# Patient Record
Sex: Female | Born: 1955 | Race: White | Hispanic: No | Marital: Married | Smoking: Former smoker
Health system: Southern US, Community
[De-identification: ages and names within clinical notes are randomized; demographics above are authoritative.]

## PROBLEM LIST (undated history)

## (undated) DIAGNOSIS — H01003 Unspecified blepharitis right eye, unspecified eyelid: Secondary | ICD-10-CM

## (undated) DIAGNOSIS — H35371 Puckering of macula, right eye: Secondary | ICD-10-CM

## (undated) DIAGNOSIS — H409 Unspecified glaucoma: Secondary | ICD-10-CM

## (undated) DIAGNOSIS — I10 Essential (primary) hypertension: Secondary | ICD-10-CM

## (undated) DIAGNOSIS — E785 Hyperlipidemia, unspecified: Secondary | ICD-10-CM

## (undated) DIAGNOSIS — F419 Anxiety disorder, unspecified: Secondary | ICD-10-CM

## (undated) DIAGNOSIS — H3530 Unspecified macular degeneration: Secondary | ICD-10-CM

## (undated) DIAGNOSIS — M1990 Unspecified osteoarthritis, unspecified site: Secondary | ICD-10-CM

## (undated) DIAGNOSIS — K219 Gastro-esophageal reflux disease without esophagitis: Secondary | ICD-10-CM

## (undated) DIAGNOSIS — I471 Supraventricular tachycardia, unspecified: Secondary | ICD-10-CM

## (undated) DIAGNOSIS — E038 Other specified hypothyroidism: Secondary | ICD-10-CM

## (undated) DIAGNOSIS — E063 Autoimmune thyroiditis: Secondary | ICD-10-CM

## (undated) DIAGNOSIS — H04123 Dry eye syndrome of bilateral lacrimal glands: Secondary | ICD-10-CM

## (undated) DIAGNOSIS — J309 Allergic rhinitis, unspecified: Secondary | ICD-10-CM

## (undated) DIAGNOSIS — K449 Diaphragmatic hernia without obstruction or gangrene: Secondary | ICD-10-CM

## (undated) DIAGNOSIS — R519 Headache, unspecified: Secondary | ICD-10-CM

## (undated) HISTORY — PX: BREAST EXCISIONAL BIOPSY: SUR124

## (undated) HISTORY — PX: HAND SURGERY: SHX662

## (undated) HISTORY — PX: COLONOSCOPY: SHX174

## (undated) HISTORY — PX: CATARACT EXTRACTION: SUR2

## (undated) HISTORY — PX: LAMINECTOMY: SHX219

## (undated) HISTORY — PX: TONSILLECTOMY: SUR1361

## (undated) HISTORY — DX: Allergic rhinitis, unspecified: J30.9

## (undated) HISTORY — PX: CHOLECYSTECTOMY: SHX55

## (undated) HISTORY — PX: YAG LASER APPLICATION: SHX6189

## (undated) HISTORY — PX: VAGINAL HYSTERECTOMY: SUR661

## (undated) HISTORY — PX: APPENDECTOMY: SHX54

## (undated) HISTORY — PX: BIOPSY THYROID: PRO38

## (undated) HISTORY — DX: Unspecified osteoarthritis, unspecified site: M19.90

## (undated) HISTORY — PX: BACK SURGERY: SHX140

## (undated) HISTORY — PX: OTHER SURGICAL HISTORY: SHX169

## (undated) HISTORY — DX: Hyperlipidemia, unspecified: E78.5

---

## 2011-11-14 ENCOUNTER — Other Ambulatory Visit: Payer: Self-pay | Admitting: Internal Medicine

## 2011-11-14 DIAGNOSIS — Z1231 Encounter for screening mammogram for malignant neoplasm of breast: Secondary | ICD-10-CM

## 2011-11-26 ENCOUNTER — Ambulatory Visit
Admission: RE | Admit: 2011-11-26 | Discharge: 2011-11-26 | Disposition: A | Discharge status: Home / Self Care | Payer: Managed Care, Other (non HMO) | Source: Ambulatory Visit | Attending: Internal Medicine | Admitting: Internal Medicine

## 2011-11-26 DIAGNOSIS — Z1231 Encounter for screening mammogram for malignant neoplasm of breast: Secondary | ICD-10-CM

## 2011-12-04 ENCOUNTER — Other Ambulatory Visit: Payer: Self-pay | Admitting: Internal Medicine

## 2011-12-04 DIAGNOSIS — R928 Other abnormal and inconclusive findings on diagnostic imaging of breast: Secondary | ICD-10-CM

## 2011-12-17 ENCOUNTER — Ambulatory Visit
Admission: RE | Admit: 2011-12-17 | Discharge: 2011-12-17 | Disposition: A | Discharge status: Home / Self Care | Payer: Managed Care, Other (non HMO) | Source: Ambulatory Visit | Attending: Internal Medicine | Admitting: Internal Medicine

## 2011-12-17 DIAGNOSIS — R928 Other abnormal and inconclusive findings on diagnostic imaging of breast: Secondary | ICD-10-CM

## 2012-04-10 ENCOUNTER — Other Ambulatory Visit: Payer: Self-pay | Admitting: Internal Medicine

## 2012-04-10 DIAGNOSIS — N6002 Solitary cyst of left breast: Secondary | ICD-10-CM

## 2012-06-17 ENCOUNTER — Ambulatory Visit
Admission: RE | Admit: 2012-06-17 | Discharge: 2012-06-17 | Disposition: A | Discharge status: Home / Self Care | Payer: Managed Care, Other (non HMO) | Source: Ambulatory Visit | Attending: Internal Medicine | Admitting: Internal Medicine

## 2012-06-17 DIAGNOSIS — N6002 Solitary cyst of left breast: Secondary | ICD-10-CM

## 2012-07-16 ENCOUNTER — Other Ambulatory Visit: Payer: Self-pay | Admitting: *Deleted

## 2012-07-16 DIAGNOSIS — M5410 Radiculopathy, site unspecified: Secondary | ICD-10-CM

## 2012-07-20 ENCOUNTER — Other Ambulatory Visit: Payer: Managed Care, Other (non HMO)

## 2012-07-23 ENCOUNTER — Ambulatory Visit
Admission: RE | Admit: 2012-07-23 | Discharge: 2012-07-23 | Disposition: A | Discharge status: Home / Self Care | Payer: Managed Care, Other (non HMO) | Source: Ambulatory Visit | Attending: *Deleted | Admitting: *Deleted

## 2012-07-23 DIAGNOSIS — M5410 Radiculopathy, site unspecified: Secondary | ICD-10-CM

## 2012-07-23 MED ORDER — GADOBENATE DIMEGLUMINE 529 MG/ML IV SOLN
18.0000 mL | Freq: Once | INTRAVENOUS | Status: AC | PRN
  Administered 2012-07-23: 18 mL via INTRAVENOUS

## 2012-10-14 ENCOUNTER — Other Ambulatory Visit: Payer: Self-pay

## 2012-10-14 DIAGNOSIS — Z1231 Encounter for screening mammogram for malignant neoplasm of breast: Secondary | ICD-10-CM

## 2012-11-26 ENCOUNTER — Ambulatory Visit
Admission: RE | Admit: 2012-11-26 | Discharge: 2012-11-26 | Disposition: A | Discharge status: Home / Self Care | Payer: Managed Care, Other (non HMO) | Source: Ambulatory Visit

## 2012-11-26 DIAGNOSIS — Z1231 Encounter for screening mammogram for malignant neoplasm of breast: Secondary | ICD-10-CM

## 2013-12-06 ENCOUNTER — Other Ambulatory Visit: Payer: Self-pay

## 2013-12-06 DIAGNOSIS — Z1231 Encounter for screening mammogram for malignant neoplasm of breast: Secondary | ICD-10-CM

## 2013-12-24 ENCOUNTER — Ambulatory Visit
Admission: RE | Admit: 2013-12-24 | Discharge: 2013-12-24 | Disposition: A | Discharge status: Home / Self Care | Payer: 59 | Source: Ambulatory Visit

## 2013-12-24 DIAGNOSIS — Z1231 Encounter for screening mammogram for malignant neoplasm of breast: Secondary | ICD-10-CM

## 2013-12-28 ENCOUNTER — Other Ambulatory Visit: Payer: Self-pay | Admitting: Internal Medicine

## 2013-12-28 DIAGNOSIS — R928 Other abnormal and inconclusive findings on diagnostic imaging of breast: Secondary | ICD-10-CM

## 2014-01-05 ENCOUNTER — Ambulatory Visit
Admission: RE | Admit: 2014-01-05 | Discharge: 2014-01-05 | Disposition: A | Discharge status: Home / Self Care | Payer: 59 | Source: Ambulatory Visit | Attending: Internal Medicine | Admitting: Internal Medicine

## 2014-01-05 DIAGNOSIS — R928 Other abnormal and inconclusive findings on diagnostic imaging of breast: Secondary | ICD-10-CM

## 2014-04-04 ENCOUNTER — Other Ambulatory Visit: Payer: Self-pay | Admitting: Internal Medicine

## 2014-04-04 DIAGNOSIS — I8390 Asymptomatic varicose veins of unspecified lower extremity: Secondary | ICD-10-CM

## 2014-04-14 ENCOUNTER — Other Ambulatory Visit: Payer: 59

## 2014-04-14 ENCOUNTER — Ambulatory Visit
Admission: RE | Admit: 2014-04-14 | Discharge: 2014-04-14 | Disposition: A | Discharge status: Home / Self Care | Payer: 59 | Source: Ambulatory Visit | Attending: Internal Medicine | Admitting: Internal Medicine

## 2014-04-14 ENCOUNTER — Other Ambulatory Visit: Payer: Self-pay | Admitting: Internal Medicine

## 2014-04-14 DIAGNOSIS — I8390 Asymptomatic varicose veins of unspecified lower extremity: Secondary | ICD-10-CM | POA: Insufficient documentation

## 2014-04-14 HISTORY — DX: Unspecified osteoarthritis, unspecified site: M19.90

## 2014-04-14 HISTORY — DX: Autoimmune thyroiditis: E06.3

## 2014-04-14 HISTORY — DX: Diaphragmatic hernia without obstruction or gangrene: K44.9

## 2014-04-14 HISTORY — DX: Unspecified macular degeneration: H35.30

## 2014-04-14 HISTORY — DX: Other specified hypothyroidism: E03.8

## 2014-04-14 HISTORY — DX: Essential (primary) hypertension: I10

## 2014-04-14 NOTE — Consult Note (Signed)
Chief Complaint: Chief Complaint  Patient presents with  . Advice Only    Consult for Varicose Veins    Referring Physician(s): Ramachandran,Ajith  History of Present Illness: Diana Mcpherson is a 59 y.o. female bilateral lower extremity pain. She says that she's been having symptoms in her lower extremities for years but it appears to be getting worse. The symptoms are more prominent in the left lower extremity than the right. The pain ranges from a deep dull aching pain to sharp pains. She says that the symptoms can last 2-3 days on some occasions. She says the most prominent pain is along the medial aspect of the left calf associated with visible varicosities. She does wear compression stockings on occasion and says that it gives her minimal relief. She complains of some occasional lower extremity and ankle swelling. She denies skin breakdown or ulcerations. She occasionally has shooting pains in her legs and has chronic back problems.   Past Medical History  Diagnosis Date  . Hypertension   . Hypothyroidism due to Hashimoto's thyroiditis   . Hiatal hernia   . Macular degeneration disease   . Osteoarthritis     Past Surgical History  Procedure Laterality Date  . Back surgery    . Tonsillectomy    . Cholecystectomy    . Appendectomy    . Vaginal hysterectomy      Allergies: Contrast media and Morphine and related  Medications: Prior to Admission medications   Medication Sig Start Date End Date Taking? Authorizing Provider  atorvastatin (LIPITOR) 10 MG tablet Take 10 mg by mouth daily.   Yes Historical Provider, MD  Calcium-Vitamin D (CALTRATE 600 PLUS-VIT D PO) Take 1 tablet by mouth daily.   Yes Historical Provider, MD  conjugated estrogens (PREMARIN) vaginal cream Place 1 Applicatorful vaginally daily.   Yes Historical Provider, MD  Cranberry 1000 MG CAPS Take 1,000 mg by mouth 2 (two) times daily.   Yes Historical Provider, MD  diazepam (VALIUM) 10 MG tablet Take 10  mg by mouth every 6 (six) hours as needed for anxiety.   Yes Historical Provider, MD  estradiol (VIVELLE-DOT) 0.025 MG/24HR Place 1 patch onto the skin 2 (two) times a week.   Yes Historical Provider, MD  HYDROcodone-acetaminophen (NORCO) 10-325 MG per tablet Take 1 tablet by mouth every 6 (six) hours as needed.   Yes Historical Provider, MD  levothyroxine (SYNTHROID, LEVOTHROID) 88 MCG tablet Take 88 mcg by mouth daily before breakfast.   Yes Historical Provider, MD  Misc Natural Products (OSTEO BI-FLEX ADV DOUBLE ST PO) Take by mouth 2 (two) times daily.   Yes Historical Provider, MD  Multiple Vitamins-Minerals (EYE VITAMINS) CAPS Take 1 capsule by mouth 2 (two) times daily.   Yes Historical Provider, MD  Omega-3 Fatty Acids (FISH OIL) 1000 MG CAPS Take 1,000 mg by mouth 2 (two) times daily.   Yes Historical Provider, MD  valsartan (DIOVAN) 80 MG tablet Take 80 mg by mouth daily.   Yes Historical Provider, MD     No family history on file.  History   Social History  . Marital Status: Married    Spouse Name: N/A  . Number of Children: N/A  . Years of Education: N/A   Social History Main Topics  . Smoking status: Former Smoker -- 1.00 packs/day    Types: Cigarettes    Start date: 04/13/1972    Quit date: 04/14/1979  . Smokeless tobacco: Never Used  . Alcohol Use: No  .  Drug Use: Not on file  . Sexual Activity: Not on file   Other Topics Concern  . None   Social History Narrative  . None      Review of Systems  Musculoskeletal: Positive for back pain.  Neurological:       Occasional "nerve pain" in legs    Vital Signs: BP 138/78 mmHg  Pulse 72  Temp(Src) 97.7 F (36.5 C) (Oral)  Resp 14  Ht  (1.676 m)  Wt 177 lb (80.287 kg)  BMI 28.58 kg/m2  SpO2 100%  Physical Exam  Constitutional: She is oriented to person, place, and time. She appears well-developed and well-nourished.  Cardiovascular: Normal rate, regular rhythm and normal heart sounds.     Pulmonary/Chest: Effort normal and breath sounds normal.  Abdominal: Soft. There is tenderness.  Tenderness to deep palpation in the abdomen.  Musculoskeletal:  Right leg:  Palpable and visible spider veins along the lateral right calf. No large varicosities. Strong palpable pulse at the right DP. Mild swelling in the right ankle and difficult to palpate the PT. Difficult to palpate the right groin pulse.  Left leg: Visible and palpable varicosities along the medial left upper calf. Strong left DP pulse. Unable to palpate the left PT because the patient complained of a shooting pain whenever I would touch this area. Minimal swelling of the left ankle. No erythema.  Neurological: She is alert and oriented to person, place, and time.      Imaging: 04/14/14 CLINICAL DATA: 59 year old with bilateral leg pain, left side greater than right. Visible varicosities in the left calf.  EXAM: BILATERAL LOWER EXTREMITY VENOUS DUPLEX ULTRASOUND  TECHNIQUE: Gray-scale sonography with graded compression, as well as color Doppler and duplex ultrasound, were performed to evaluate the deep and superficial veins of both lower extremities. Spectral Doppler was utilized to evaluate flow at rest and with distal augmentation maneuvers. A complete superficial venous insufficiency exam was performed in the upright standing position. I personally performed the technical portion of the exam.  COMPARISON: None.  FINDINGS: Right lower extremity:  Deep venous system: Normal compressibility, augmentation and color Doppler flow in the right common femoral vein, right femoral vein and right popliteal vein. The right saphenofemoral junction is patent.  Superficial venous system: No reflux at the right saphenofemoral junction. There is extensive reflux in the proximal thigh great saphenous vein measuring up to 5.5 seconds. Proximal right thigh great saphenous vein measures up to 7 mm. There is also  enlargement of the distal thigh great saphenous vein measuring up to 8 mm and there is approximately 5.6 seconds of reflux in this segment. Prominent perforator branch in the mid calf. No significant reflux in the calf great saphenous vein. Normal appearance of the right short saphenous vein without reflux.  Left lower extremity:  Deep venous system: Normal compressibility, augmentation and color Doppler flow in the left common femoral vein, left femoral vein and left popliteal vein.  Superficial venous system: The proximal thigh left great saphenous vein is enlarged measuring up to 12 mm and there is 5.5 seconds of reflux. 2.3 seconds reflux in the mid thigh great saphenous vein. 2.3 seconds of reflux in the distal thigh great saphenous vein. 2.3 seconds of reflux in the left great saphenous vein at the knee. There are large varicosities coming off the proximal calf great saphenous vein. These varicose veins demonstrate reflux. No significant reflux in the left great saphenous vein distal to the varicose veins. Normal appearance of the left  short saphenous vein without reflux.  IMPRESSION: Positive for superficial venous insufficiency in the great saphenous veins bilaterally. Prominent varicose veins associated with the left great saphenous vein in the calf.  Negative for deep vein thrombosis.  Labs:  CBC: No results for input(s): WBC, HGB, HCT, PLT in the last 8760 hours.  COAGS: No results for input(s): INR, APTT in the last 8760 hours.  BMP: No results for input(s): NA, K, CL, CO2, GLUCOSE, BUN, CALCIUM, CREATININE, GFRNONAA, GFRAA in the last 8760 hours.  Invalid input(s): CMP  Assessment and Plan:  59 year old with bilateral leg pain and visible varicosities in the left calf. Based on physical exam and ultrasound findings, the patient has superficial venous insufficiency in the great saphenous veins bilaterally. The CEAP classification is C2, Ep, As, Pr. The  patient is symptomatically in both lower extremities but the left side is much worse than the right. The patient has been using compression stockings intermittently and I feel that she has failed conservative management. The patient has strange neurologic sensations with shooting pains in both lower extremity. I suspect that these symptoms are neurologic in origin and separate from the venous insufficiency symptoms.  The patient is a good candidate for endovascular laser treatment to the great saphenous veins bilaterally. The left lower extremity is the more symptomatic side and we would plan to treat this leg first. In addition, there are perforator branches and large varicosities in the left leg and the patient may also need ultrasound-guided foam sclerotherapy in the left lower extremity. I have given the patient a prescription for lower extremity compression stockings measuring 20-30 mmHg. I have encouraged patient to wear these stockings as much as possible, particularly when she is going to be sitting or standing for long periods of time.  The patient would like to think about the endovascular treatment a little longer and she will need to consider the timing of the procedure because we recommend limiting travel for one month after the procedure. Patient will contact us when she would like to proceed with the endovascular treatment.  Thank you for this interesting consult.  I greatly enjoyed meeting Diana Bearderesa Accomando and look forward to participating in their care.  SignedAbundio Miu: Jamason Peckham RYAN 04/14/2014, 2:44 PM   I spent a total of  20 Minutes  in face to face in clinical consultation, greater than 50% of which was counseling/coordinating care for lower extremity venous insufficiency.

## 2014-07-04 ENCOUNTER — Emergency Department (HOSPITAL_COMMUNITY)
Admission: EM | Admit: 2014-07-04 | Discharge: 2014-07-04 | Disposition: A | Discharge status: Home / Self Care | Payer: Managed Care, Other (non HMO) | Attending: Emergency Medicine | Admitting: Emergency Medicine

## 2014-07-04 ENCOUNTER — Encounter (HOSPITAL_COMMUNITY): Payer: Self-pay | Admitting: Emergency Medicine

## 2014-07-04 DIAGNOSIS — I10 Essential (primary) hypertension: Secondary | ICD-10-CM | POA: Diagnosis not present

## 2014-07-04 DIAGNOSIS — Z8719 Personal history of other diseases of the digestive system: Secondary | ICD-10-CM | POA: Diagnosis not present

## 2014-07-04 DIAGNOSIS — R109 Unspecified abdominal pain: Secondary | ICD-10-CM | POA: Diagnosis not present

## 2014-07-04 DIAGNOSIS — Z87891 Personal history of nicotine dependence: Secondary | ICD-10-CM | POA: Diagnosis not present

## 2014-07-04 DIAGNOSIS — E039 Hypothyroidism, unspecified: Secondary | ICD-10-CM | POA: Insufficient documentation

## 2014-07-04 DIAGNOSIS — Z79899 Other long term (current) drug therapy: Secondary | ICD-10-CM | POA: Insufficient documentation

## 2014-07-04 DIAGNOSIS — M5441 Lumbago with sciatica, right side: Secondary | ICD-10-CM | POA: Diagnosis not present

## 2014-07-04 DIAGNOSIS — M545 Low back pain: Secondary | ICD-10-CM | POA: Diagnosis present

## 2014-07-04 LAB — URINALYSIS, ROUTINE W REFLEX MICROSCOPIC
Bilirubin Urine: NEGATIVE
GLUCOSE, UA: NEGATIVE mg/dL
Ketones, ur: NEGATIVE mg/dL
LEUKOCYTES UA: NEGATIVE
Nitrite: NEGATIVE
PH: 7 (ref 5.0–8.0)
Protein, ur: NEGATIVE mg/dL
Specific Gravity, Urine: 1.007 (ref 1.005–1.030)
Urobilinogen, UA: 0.2 mg/dL (ref 0.0–1.0)

## 2014-07-04 LAB — URINE MICROSCOPIC-ADD ON

## 2014-07-04 MED ORDER — OXYCODONE-ACETAMINOPHEN 5-325 MG PO TABS: 2.0000 | ORAL_TABLET | ORAL | Status: DC | PRN

## 2014-07-04 NOTE — ED Provider Notes (Signed)
CSN: [id number]     Arrival date & time 07/04/14  1516 History   First MD Initiated Contact with Patient 07/04/14 2004     Chief Complaint  Patient presents with  . Back Pain  . Abdominal Pain     (Consider location/radiation/quality/duration/timing/severity/associated sxs/prior Treatment) Patient is a 59 y.o. female presenting with back pain and abdominal pain. The history is provided by the patient. No language interpreter was used.  Back Pain Associated symptoms: no abdominal pain, no numbness and no weakness   Abdominal Pain  [name] is a 59 year old female with a history of hypertension, hypothyroidism, osteoarthritis, laminectomy and fusion at L4-L5, who presents for sudden onset severe low back pain while moving suddenly on Wednesday. She states she drove to [place] on Monday and was fine at that time. She states it was aggravated when driving back from [place] on Friday. She states she took Vicodin, Valium, used ice with minimal relief. She states the pain radiates along the right buttocks and thigh. She does state there was pain in her right fifth toe for 3 days but is since resolved. She denies any bowel or bladder incontinence or retention, recent prednisone use, IV drug use, history of cancer. She denies any weakness, numbness, tingling in the right leg. She denies any fever, chills, abdominal pain, nausea, vomiting. She denies any recent history of fall or injury. Past Medical History  Diagnosis Date  . Hypertension   . Hypothyroidism due to Hashimoto's thyroiditis   . Hiatal hernia   . Macular degeneration disease   . Osteoarthritis    Past Surgical History  Procedure Laterality Date  . Back surgery    . Tonsillectomy    . Cholecystectomy    . Appendectomy    . Vaginal hysterectomy     No family history on file. History  Substance Use Topics  . Smoking status: Former Smoker -- 1.00 packs/day    Types: Cigarettes    Start date: 04/13/1972    Quit date:  04/14/1979  . Smokeless tobacco: Never Used  . Alcohol Use: No   OB History    No data available     Review of Systems  Gastrointestinal: Negative for abdominal pain.  Musculoskeletal: Positive for back pain. Negative for myalgias, joint swelling, arthralgias, gait problem, neck pain and neck stiffness.  Neurological: Negative for weakness and numbness.  All other systems reviewed and are negative.     Allergies  Contrast media; Cymbalta; Relafen; and Morphine and related  Home Medications   Prior to Admission medications   Medication Sig Start Date End Date Taking? Authorizing Provider  atorvastatin (LIPITOR) 10 MG tablet Take 10 mg by mouth 3 (three) times a week. Mon / Wed / Fri   Yes Historical Provider, MD  calcium carbonate (TUMS - DOSED IN MG ELEMENTAL CALCIUM) 500 MG chewable tablet Chew 1 tablet by mouth daily as needed for indigestion or heartburn.   Yes Historical Provider, MD  Calcium-Vitamin D (CALTRATE 600 PLUS-VIT D PO) Take 1 tablet by mouth daily.   Yes Historical Provider, MD  cephALEXin (KEFLEX) 250 MG capsule Take 250 mg by mouth daily as needed (after intercourse).   Yes Historical Provider, MD  conjugated estrogens (PREMARIN) vaginal cream Place 1 Applicatorful vaginally 3 (three) times a week. Mon / Wed / Fri   Yes Historical Provider, MD  Cranberry 1000 MG CAPS Take 1,000 mg by mouth 2 (two) times daily.   Yes Historical Provider, MD  diazepam (VALIUM) 10  MG tablet Take 10 mg by mouth every 6 (six) hours as needed for anxiety.   Yes Historical Provider, MD  docusate sodium (COLACE) 100 MG capsule Take 100 mg by mouth daily as needed for mild constipation.   Yes Historical Provider, MD  estradiol (VIVELLE-DOT) 0.025 MG/24HR Place 1 patch onto the skin 2 (two) times a week. Sat / Wed   Yes Historical Provider, MD  HYDROcodone-acetaminophen (NORCO) 10-325 MG per tablet Take 1 tablet by mouth every 6 (six) hours as needed for moderate pain.    Yes Historical  Provider, MD  levothyroxine (SYNTHROID, LEVOTHROID) 88 MCG tablet Take 88 mcg by mouth daily before breakfast.   Yes Historical Provider, MD  Misc Natural Products (OSTEO BI-FLEX ADV DOUBLE ST PO) Take 1 tablet by mouth 2 (two) times daily.    Yes Historical Provider, MD  Multiple Vitamins-Minerals (EYE VITAMINS) CAPS Take 1 capsule by mouth 2 (two) times daily.   Yes Historical Provider, MD  saccharomyces boulardii (FLORASTOR) 250 MG capsule Take 250 mg by mouth daily.   Yes Historical Provider, MD  valsartan (DIOVAN) 80 MG tablet Take 80 mg by mouth daily.   Yes Historical Provider, MD  oxyCODONE-acetaminophen (PERCOCET/ROXICET) 5-325 MG per tablet Take 2 tablets by mouth every 4 (four) hours as needed for severe pain. 07/04/14   Jocelin Schuelke Patel-Mills, PA-C   BP 160/80 mmHg  Pulse 65  Temp(Src) 97.9 F (36.6 C) (Oral)  Resp 18  Wt 173 lb (78.472 kg)  SpO2 100% Physical Exam  Constitutional: She is oriented to person, place, and time. She appears well-developed and well-nourished.  HENT:  Head: Normocephalic and atraumatic.  Eyes: Conjunctivae are normal.  Neck: Normal range of motion. Neck supple.  Cardiovascular: Normal rate, regular rhythm and normal heart sounds.   Pulmonary/Chest: Effort normal and breath sounds normal.  Abdominal: Soft. She exhibits no distension. There is no tenderness.  Musculoskeletal: Normal range of motion.  5 out of 5 strength in lower bilateral extremities. She is able to dorsi and plantar flex without difficulty. No saddle anesthesia. 1+ patellar and achilles reflexes bilaterally. She is ambulatory with steady gait. Vertical 20 cm surgical scar on the lumbar spine. No midline tenderness. No reproducible paravertebral lumbar tenderness. She has good sensation throughout the eye lateral lower extremities. Good bilateral DP pulses. She is able to flex and extend at the knees and hips.  Neurological: She is alert and oriented to person, place, and time.  Skin: Skin  is warm and dry.  Psychiatric: She has a normal mood and affect. Her behavior is normal.  Nursing note and vitals reviewed.   ED Course  Procedures (including critical care time) Labs Review Labs Reviewed  URINALYSIS, ROUTINE W REFLEX MICROSCOPIC (NOT AT [facility]) - Abnormal; Notable for the following:    Hgb urine dipstick SMALL (*)    All other components within normal limits  URINE MICROSCOPIC-ADD ON    Imaging Review No results found.   EKG Interpretation None      MDM   Final diagnoses:  Right-sided low back pain with right-sided sciatica  Patient's vitals are stable. She denies any abdominal pain as seen in the triage note. She has no signs of spinal cord compression. UA negative. I offered the patient prednisone which she states upsets her stomach. She also was offered Valium and Vicodin which she states she also has a home. She refused Flexeril. I offered to x-ray the patient since she had previous surgery and hardware placement the patient  refused. Patient was given Percocet for breakthrough pain. I thoroughly discussed return precautions such as bowel or bladder incontinence or retention, numbness or weakness in the lower extremities, fever, inability to ambulate. Patient verbally agreed with the plan. She states she has follow-up and can be seen as early as Friday with her physician.    Catha Gosselin, PA-C 07/05/14 [id number]  Pricilla Loveless, MD 07/06/14 2124

## 2014-07-04 NOTE — ED Notes (Signed)
Pt reports lower back pain worse since last Wednesday. sts she tweaked her back at that point. Pt also reports R lower abdominal pain and slower urinary flow.

## 2014-07-04 NOTE — ED Notes (Signed)
PA at bedside.

## 2014-07-04 NOTE — ED Notes (Signed)
Pt ambulatory during discharge. No distress noted.

## 2014-07-04 NOTE — Discharge Instructions (Signed)
Back Exercises Return for any fever, bowel or bladder incontinence or retention, weakness or inability to ambulate, numbness to the lower extremity. Back exercises help treat and prevent back injuries. The goal of back exercises is to increase the strength of your abdominal and back muscles and the flexibility of your back. These exercises should be started when you no longer have back pain. Back exercises include:  Pelvic Tilt. Lie on your back with your knees bent. Tilt your pelvis until the lower part of your back is against the floor. Hold this position 5 to 10 sec and repeat 5 to 10 times.  Knee to Chest. Pull first 1 knee up against your chest and hold for 20 to 30 seconds, repeat this with the other knee, and then both knees. This may be done with the other leg straight or bent, whichever feels better.  Sit-Ups or Curl-Ups. Bend your knees 90 degrees. Start with tilting your pelvis, and do a partial, slow sit-up, lifting your trunk only 30 to 45 degrees off the floor. Take at least 2 to 3 seconds for each sit-up. Do not do sit-ups with your knees out straight. If partial sit-ups are difficult, simply do the above but with only tightening your abdominal muscles and holding it as directed.  Hip-Lift. Lie on your back with your knees flexed 90 degrees. Push down with your feet and shoulders as you raise your hips a couple inches off the floor; hold for 10 seconds, repeat 5 to 10 times.  Back arches. Lie on your stomach, propping yourself up on bent elbows. Slowly press on your hands, causing an arch in your low back. Repeat 3 to 5 times. Any initial stiffness and discomfort should lessen with repetition over time.  Shoulder-Lifts. Lie face down with arms beside your body. Keep hips and torso pressed to floor as you slowly lift your head and shoulders off the floor. Do not overdo your exercises, especially in the beginning. Exercises may cause you some mild back discomfort which lasts for a few  minutes; however, if the pain is more severe, or lasts for more than 15 minutes, do not continue exercises until you see your caregiver. Improvement with exercise therapy for back problems is slow.  See your caregivers for assistance with developing a proper back exercise program. Document Released: 02/01/2004 Document Revised: 03/18/2011 Document Reviewed: 10/25/2010 [facility] Patient Information 2015 [place], [place]. This information is not intended to replace advice given to you by your health care provider. Make sure you discuss any questions you have with your health care provider.

## 2014-07-04 NOTE — ED Notes (Signed)
Pt up ambulatory to the bathroom to attempt an urine specimen at this time

## 2014-07-19 ENCOUNTER — Other Ambulatory Visit: Payer: Self-pay | Admitting: Neurosurgery

## 2014-07-19 DIAGNOSIS — M5416 Radiculopathy, lumbar region: Secondary | ICD-10-CM

## 2014-07-27 ENCOUNTER — Ambulatory Visit
Admission: RE | Admit: 2014-07-27 | Discharge: 2014-07-27 | Disposition: A | Discharge status: Home / Self Care | Payer: Managed Care, Other (non HMO) | Source: Ambulatory Visit | Attending: Neurosurgery | Admitting: Neurosurgery

## 2014-07-27 DIAGNOSIS — M5416 Radiculopathy, lumbar region: Secondary | ICD-10-CM

## 2014-07-27 MED ORDER — GADOBENATE DIMEGLUMINE 529 MG/ML IV SOLN
16.0000 mL | Freq: Once | INTRAVENOUS | Status: AC | PRN
  Administered 2014-07-27: 16 mL via INTRAVENOUS

## 2014-12-05 ENCOUNTER — Other Ambulatory Visit: Payer: Self-pay

## 2014-12-05 DIAGNOSIS — Z1231 Encounter for screening mammogram for malignant neoplasm of breast: Secondary | ICD-10-CM

## 2015-01-06 ENCOUNTER — Ambulatory Visit
Admission: RE | Admit: 2015-01-06 | Discharge: 2015-01-06 | Disposition: A | Discharge status: Home / Self Care | Payer: Managed Care, Other (non HMO) | Source: Ambulatory Visit

## 2015-01-06 DIAGNOSIS — Z1231 Encounter for screening mammogram for malignant neoplasm of breast: Secondary | ICD-10-CM

## 2015-11-24 ENCOUNTER — Other Ambulatory Visit: Payer: Self-pay | Admitting: Neurosurgery

## 2015-11-24 DIAGNOSIS — M5412 Radiculopathy, cervical region: Secondary | ICD-10-CM

## 2015-11-28 ENCOUNTER — Other Ambulatory Visit: Payer: Self-pay | Admitting: Internal Medicine

## 2015-11-28 DIAGNOSIS — Z1231 Encounter for screening mammogram for malignant neoplasm of breast: Secondary | ICD-10-CM

## 2015-12-07 ENCOUNTER — Ambulatory Visit
Admission: RE | Admit: 2015-12-07 | Discharge: 2015-12-07 | Disposition: A | Discharge status: Home / Self Care | Payer: Managed Care, Other (non HMO) | Source: Ambulatory Visit | Attending: Neurosurgery | Admitting: Neurosurgery

## 2015-12-07 DIAGNOSIS — M5412 Radiculopathy, cervical region: Secondary | ICD-10-CM

## 2016-01-05 ENCOUNTER — Ambulatory Visit: Payer: Managed Care, Other (non HMO)

## 2016-03-05 ENCOUNTER — Ambulatory Visit
Admission: RE | Admit: 2016-03-05 | Discharge: 2016-03-05 | Disposition: A | Discharge status: Home / Self Care | Payer: Managed Care, Other (non HMO) | Source: Ambulatory Visit | Attending: Internal Medicine | Admitting: Internal Medicine

## 2016-03-05 DIAGNOSIS — Z1231 Encounter for screening mammogram for malignant neoplasm of breast: Secondary | ICD-10-CM

## 2017-01-31 ENCOUNTER — Other Ambulatory Visit: Payer: Self-pay | Admitting: Family Medicine

## 2017-01-31 DIAGNOSIS — Z139 Encounter for screening, unspecified: Secondary | ICD-10-CM

## 2017-02-20 ENCOUNTER — Other Ambulatory Visit: Payer: Self-pay | Admitting: Internal Medicine

## 2017-02-20 ENCOUNTER — Other Ambulatory Visit (HOSPITAL_COMMUNITY): Payer: Self-pay | Admitting: Diagnostic Radiology

## 2017-02-20 DIAGNOSIS — I83813 Varicose veins of bilateral lower extremities with pain: Secondary | ICD-10-CM

## 2017-02-20 DIAGNOSIS — I8390 Asymptomatic varicose veins of unspecified lower extremity: Secondary | ICD-10-CM

## 2017-03-18 ENCOUNTER — Ambulatory Visit
Admission: RE | Admit: 2017-03-18 | Discharge: 2017-03-18 | Disposition: A | Discharge status: Home / Self Care | Payer: Managed Care, Other (non HMO) | Source: Ambulatory Visit | Attending: Family Medicine | Admitting: Family Medicine

## 2017-03-18 DIAGNOSIS — Z139 Encounter for screening, unspecified: Secondary | ICD-10-CM

## 2017-03-19 ENCOUNTER — Ambulatory Visit: Payer: Managed Care, Other (non HMO)

## 2017-04-08 ENCOUNTER — Other Ambulatory Visit: Payer: Managed Care, Other (non HMO)

## 2017-04-24 ENCOUNTER — Ambulatory Visit
Admission: RE | Admit: 2017-04-24 | Discharge: 2017-04-24 | Disposition: A | Discharge status: Home / Self Care | Payer: Managed Care, Other (non HMO) | Source: Ambulatory Visit | Attending: Diagnostic Radiology | Admitting: Diagnostic Radiology

## 2017-04-24 DIAGNOSIS — I83813 Varicose veins of bilateral lower extremities with pain: Secondary | ICD-10-CM

## 2017-04-24 NOTE — Progress Notes (Signed)
Chief Complaint: Patient was seen in consultation today for  Chief Complaint  Patient presents with  . Consult    [facility]   at the request of Velina Drollinger  Referring Physician(s): Ramachandran,Ajith  History of Present Illness: Amarri Michaelson is a 62 y.o. female with bilateral lower extremity venous insufficiency and I evaluated her 3 years ago in 2016 for her leg symptoms.  In 2016, the patient was found to have reflux in bilateral great saphenous veins and she decided to delay treatment because she was dealing with family health problems and was concerned about traveling after the procedure.  The symptoms in the lower extremities have not improved.  Patient continues to have more symptoms in the left leg compared to the right.  She describes an episode last fall where she had severe pain in the left calf that lasted for approximately a week.  The pain and discomfort is localized to her left calf varicosities.  She also has tenderness and pain in the right lower leg but not as severe as the left leg.  She continues to wear her lower extremity compression stockings but gets minimal relief.  Patient is already receiving chronic pain medications for back and neck symptoms and does not take additional pain medicine for the leg symptoms.  No history for DVT, surgery or prior trauma to the lower legs.  Swelling is not a significant problem in the lower extremities.  She denies bleeding or erythema in the lower legs.  Past Medical History:  Diagnosis Date  . Hiatal hernia   . Hypertension   . Hypothyroidism due to Hashimoto's thyroiditis   . Macular degeneration disease   . Osteoarthritis     Past Surgical History:  Procedure Laterality Date  . APPENDECTOMY    . BACK SURGERY    . BREAST EXCISIONAL BIOPSY Left   . CHOLECYSTECTOMY    . TONSILLECTOMY    . VAGINAL HYSTERECTOMY      Allergies: Contrast media [iodinated diagnostic agents]; Cymbalta [duloxetine hcl];  Gabapentin; Relafen [nabumetone]; Gadolinium derivatives; and Morphine and related  Medications: Prior to Admission medications   Medication Sig Start Date End Date Taking? Authorizing Provider  atorvastatin (LIPITOR) 10 MG tablet Take 10 mg by mouth 3 (three) times a week. Mon / Wed / Fri   Yes [provider]  Calcium-Vitamin D (CALTRATE 600 PLUS-VIT D PO) Take 1 tablet by mouth daily.   Yes [provider]  cephALEXin (KEFLEX) 250 MG capsule Take 250 mg by mouth daily as needed (after intercourse).   Yes [provider]  conjugated estrogens (PREMARIN) vaginal cream Place 1 Applicatorful vaginally 3 (three) times a week. Mon / Wed / Fri   Yes [provider]  diazepam (VALIUM) 10 MG tablet Take 10 mg by mouth every 6 (six) hours as needed for anxiety.   Yes [provider]  docusate sodium (COLACE) 100 MG capsule Take 100 mg by mouth daily as needed for mild constipation.   Yes [provider]  esomeprazole (NEXIUM) 40 MG capsule Take 40 mg by mouth daily at 12 noon.   Yes [provider]  estradiol (VIVELLE-DOT) 0.025 MG/24HR Place 1 patch onto the skin 2 (two) times a week. Sat / Wed   Yes [provider]  HYDROcodone-acetaminophen (NORCO) 10-325 MG per tablet Take 1 tablet by mouth every 6 (six) hours as needed for moderate pain.    Yes [provider]  levothyroxine (SYNTHROID, LEVOTHROID) 88  MCG tablet Take 88 mcg by mouth daily before breakfast.   Yes [provider]  Multiple Vitamins-Minerals (EYE VITAMINS) CAPS Take 1 capsule by mouth 2 (two) times daily.   Yes [provider]  saccharomyces boulardii (FLORASTOR) 250 MG capsule Take 250 mg by mouth daily.   Yes [provider]  calcium carbonate (TUMS - DOSED IN MG ELEMENTAL CALCIUM) 500 MG chewable tablet Chew 1 tablet by mouth daily as needed for indigestion or heartburn.    [provider]  Cranberry 1000 MG CAPS Take  1,000 mg by mouth 2 (two) times daily.    [provider]  Misc Natural Products (OSTEO BI-FLEX ADV DOUBLE ST PO) Take 1 tablet by mouth 2 (two) times daily.     [provider]  oxyCODONE-acetaminophen (PERCOCET/ROXICET) 5-325 MG per tablet Take 2 tablets by mouth every 4 (four) hours as needed for severe pain. Patient not taking: Reported on 04/24/2017 07/04/14   Patel-Mills, Lorelle Formosa, PA-C  valsartan (DIOVAN) 80 MG tablet Take 80 mg by mouth daily.    [provider]     Family History  Problem Relation Age of Onset  . Breast cancer Maternal Aunt   . Breast cancer Paternal Aunt   . Breast cancer Paternal Aunt     Social History   Socioeconomic History  . Marital status: Married    Spouse name: Not on file  . Number of children: Not on file  . Years of education: Not on file  . Highest education level: Not on file  Occupational History  . Not on file  Social Needs  . Financial resource strain: Not on file  . Food insecurity:    Worry: Not on file    Inability: Not on file  . Transportation needs:    Medical: Not on file    Non-medical: Not on file  Tobacco Use  . Smoking status: Former Smoker    Packs/day: 1.00    Types: Cigarettes    Start date: 04/13/1972    Last attempt to quit: 04/14/1979    Years since quitting: 38.0  . Smokeless tobacco: Never Used  Substance and Sexual Activity  . Alcohol use: No    Alcohol/week: 0.0 oz  . Drug use: Not on file  . Sexual activity: Not on file  Lifestyle  . Physical activity:    Days per week: Not on file    Minutes per session: Not on file  . Stress: Not on file  Relationships  . Social connections:    Talks on phone: Not on file    Gets together: Not on file    Attends religious service: Not on file    Active member of club or organization: Not on file    Attends meetings of clubs or organizations: Not on file    Relationship status: Not on file  Other Topics Concern  . Not on file  Social  History Narrative  . Not on file     Review of Systems  Constitutional: Negative.   Musculoskeletal: Positive for back pain.    Vital Signs: BP (!) 156/87   Pulse 77   Temp 98.7 F (37.1 C) (Oral)   Resp 16   Ht 5\' 6"  (1.676 m)   Wt 187 lb (84.8 kg)   SpO2 99%   BMI 30.18 kg/m   Physical Exam  Constitutional: She is oriented to person, place, and time. No distress.  Cardiovascular: Intact distal pulses.  Palpable dorsalis pedis pulses bilaterally.  Musculoskeletal:  Left lower extremity: Visible and palpable varicosities in the medial left calf.  Small varicosities along the anterior left thigh.  No significant edema.  No erythema or ulcerations.  Right lower: Small visible and palpable varicosities in the medial right calf.  No significant edema.  No erythema or ulcerations.  Tenderness along the medial right ankle.  Neurological: She is alert and oriented to person, place, and time.       Imaging: [place] Venous Img Lower Bilateral  Result Date: 04/24/2017 CLINICAL DATA:  62 year old with lower extremity pain and history of bilateral lower extremity venous insufficiency. Patient presents for re-evaluation and possible treatment. EXAM: BILATERAL LOWER EXTREMITY VENOUS DUPLEX ULTRASOUND TECHNIQUE: Gray-scale sonography with graded compression, as well as color Doppler and duplex ultrasound, were performed to evaluate the deep and superficial veins of both lower extremities. Spectral Doppler was utilized to evaluate flow at rest and with distal augmentation maneuvers. A complete superficial venous insufficiency exam was performed in the upright standing position. I personally performed the technical portion of the exam. COMPARISON:  04/14/2014 FINDINGS: Right lower extremity: Deep venous system: Normal compressibility, augmentation and color Doppler flow in the right common femoral vein, right femoral vein and right popliteal vein without thrombus. The right saphenofemoral junction is  patent. Right profunda femoral vein is patent without thrombus. Superficial venous system: Proximal thigh GSV measures 0.6 cm. Reflux in the proximal thigh GSV measures 6.5 seconds. Reflux in the mid thigh GSV measures 2.5 seconds. Reflux in proximal calf GSV measures 5.1 seconds. Right calf GSV measures up to 0.8 cm. Reflux in the mid calf right GSV is 4.0 seconds. Reflux in the distal calf right GSV is 2.8 seconds. Varicose vein coming off the mid calf great saphenous vein with 1.8 seconds of reflux. No reflux in the short saphenous vein. Left lower extremity: Deep venous system: Normal compressibility, augmentation and color Doppler flow in the left common femoral vein, left femoral vein and left popliteal vein without thrombus. The left saphenofemoral junction is patent. Left profunda femoral vein is patent without thrombus. Superficial venous system: Proximal thigh left great saphenous vein is enlarged measuring up to 1.3 cm. There is reflux in the proximal thigh GSV measuring 1.5 seconds. Reflux in the mid thigh GSV measuring 3.5 seconds. Distal thigh left great saphenous vein measures up to 1.3 cm. Reflux in the distal thigh great saphenous vein is 4.9 seconds. 2.7 seconds of reflux in the proximal calf left great saphenous vein. Varicosities coming off proximal calf GSV. 2.1 seconds of reflux in the mid calf GSV. 2.6 seconds of reflux in the distal calf GSV. Patient appears to have a vein of Giacomini associated with the short saphenous vein. No significant reflux in short saphenous vein. IMPRESSION: Positive for superficial venous insufficiency in the bilateral great saphenous veins. 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 Varicose veins are larger on the left. No evidence of deep venous thrombosis in the lower extremities. Electronically Signed   By: Richarda OverlieAdam  Shannia Jacuinde M.D.   On: 04/24/2017 12:50   [facility] Eval [facility]  Result Date: 04/24/2017 Please refer to "Notes" to see consult  details.   Labs:  CBC: No results for input(s): WBC, HGB, HCT, PLT in the last 8760 hours.  COAGS: No results for input(s): INR, APTT in the last 8760 hours.  BMP: No results for input(s): NA, K, CL, CO2, GLUCOSE, BUN, CALCIUM, CREATININE, GFRNONAA, GFRAA in the last 8760 hours.  Invalid input(s): CMP  LIVER FUNCTION  TESTS: No results for input(s): BILITOT, AST, ALT, ALKPHOS, PROT, ALBUMIN in the last 8760 hours.  TUMOR MARKERS: No results for input(s): AFPTM, CEA, CA199, CHROMGRNA in the last 8760 hours.  Assessment and Plan:  62 year old with bilateral lower extremity superficial venous insufficiency.  I re-evaluated the patient's lower extremity veins at today's visit.  The disease distribution is similar to the exam in 2016.  Enlargement and significant reflux in the bilateral great saphenous veins.  Varicosities associated with the great saphenous veins in both calves.  The varicosities and symptoms are worse on the left compared to the right.  The CEAP classification is C2, Ep, As, Pr in both lower extremities.  Patient has been wearing compression stockings for many years and continues to have symptoms.  Patient has failed conservative therapy with compression stockings.  Patient is a good candidate for endovascular ablation of the bilateral great saphenous veins and ultrasound-guided sclerotherapy of the varicose veins.  I explained the procedure to the patient in depth.  Patient has a very good understanding of the procedure and she would like to proceed with treatment.  Plan for treatment of the left leg first because it is more symptomatically.  Patient will need new thigh-high compression stockings measuring her 20-30 mmHg.  Plan to treat the left great saphenous vein with endovascular laser therapy and ultrasound-guided foam sclerotherapy of the varicose veins.  After treatment of the left lower extremity, we can focus on the right lower extremity that will need endovascular  laser treatment of the great saphenous vein and ultrasound-guided foam sclerotherapy of the varicose veins.  Thank you for this interesting consult.  I greatly enjoyed meeting Lynnsie Linders and look forward to participating in their care.  A copy of this report was sent to the requesting provider on this date.  Electronically Signed: Arn Medal 04/24/2017, 1:55 PM   I spent a total of    25 Minutes in face to face in clinical consultation, greater than 50% of which was counseling/coordinating care for varicose veins and venous insufficiency.  Patient ID: Diana Mcpherson, female   DOB: [birth year], 62 y.o.   MRN: [id number]

## 2017-05-12 ENCOUNTER — Other Ambulatory Visit (HOSPITAL_COMMUNITY): Payer: Self-pay | Admitting: Diagnostic Radiology

## 2017-05-12 DIAGNOSIS — I872 Venous insufficiency (chronic) (peripheral): Secondary | ICD-10-CM

## 2017-06-10 ENCOUNTER — Ambulatory Visit
Admission: RE | Admit: 2017-06-10 | Discharge: 2017-06-10 | Disposition: A | Discharge status: Home / Self Care | Payer: Managed Care, Other (non HMO) | Source: Ambulatory Visit | Attending: Diagnostic Radiology | Admitting: Diagnostic Radiology

## 2017-06-10 ENCOUNTER — Encounter: Payer: Self-pay | Admitting: Diagnostic Radiology

## 2017-06-10 ENCOUNTER — Other Ambulatory Visit (HOSPITAL_COMMUNITY): Payer: Self-pay | Admitting: Diagnostic Radiology

## 2017-06-10 DIAGNOSIS — I872 Venous insufficiency (chronic) (peripheral): Secondary | ICD-10-CM

## 2017-06-10 HISTORY — PX: IR EMBO VENOUS NOT HEMORR HEMANG  INC GUIDE ROADMAPPING: IMG5447

## 2017-06-10 NOTE — Progress Notes (Signed)
[id number]  Informed consent obtained.  Given verbal & written discharge instructions and states that she understands.    [id number]  Patient has taken Valium 10 mg po (from her personal medications).    0800  22 gauge x 1" insyte catheter started IV Left antecubital x 1 attempt w/ Saline lock and 7"extension.  Flushed with 10 mL NSS without difficulty.  0830  Procedure started.   1025  Procedure completed.  IV d/c'd Left antecubital, catheter intact.  Site unremarkable.  1030  Ambulated x 10 minutes without assistance.  Gait steady.  Reviewed discharge instructions.  Follow up appointment 06/17/2017.  Discharged to home, spouse to drive.    Diana Hoffmaster BotkinsGales, RN 06/10/2017 10:50 AM

## 2017-06-17 ENCOUNTER — Ambulatory Visit
Admission: RE | Admit: 2017-06-17 | Discharge: 2017-06-17 | Disposition: A | Discharge status: Home / Self Care | Payer: Managed Care, Other (non HMO) | Source: Ambulatory Visit | Attending: Diagnostic Radiology | Admitting: Diagnostic Radiology

## 2017-06-17 DIAGNOSIS — I872 Venous insufficiency (chronic) (peripheral): Secondary | ICD-10-CM

## 2017-06-17 NOTE — Progress Notes (Signed)
Patient ID: Diana Mcpherson, female   DOB: 14-Jun-1955, 62 y.o.   MRN: [id number]       Chief Complaint:   Varicose veins, venous insufficiency  Referring Physician(s): Henn,Adam  History of Present Illness: Diana Mcpherson is a 62 y.o. female 1 week status post left GSV transcatheter laser occlusion for venous insufficiency and varicose veins.  Patient has recovered over the last week very well.  Minor tenderness and bruising on the treated segment.  No interval fevers.  She has been compliant with walking and daily stockings.  Overall she is doing very well and reports improvement in her left leg symptoms.  Past Medical History:  Diagnosis Date  . Hiatal hernia   . Hypertension   . Hypothyroidism due to Hashimoto's thyroiditis   . Macular degeneration disease   . Osteoarthritis     Past Surgical History:  Procedure Laterality Date  . APPENDECTOMY    . BACK SURGERY    . BREAST EXCISIONAL BIOPSY Left   . CHOLECYSTECTOMY    . IR EMBO VENOUS NOT HEMORR HEMANG  INC GUIDE ROADMAPPING  06/10/2017  . TONSILLECTOMY    . VAGINAL HYSTERECTOMY      Allergies: Contrast media [iodinated diagnostic agents]; Cymbalta [duloxetine hcl]; Gabapentin; Relafen [nabumetone]; Gadolinium derivatives; and Morphine and related  Medications: Prior to Admission medications   Medication Sig Start Date End Date Taking? Authorizing Provider  atorvastatin (LIPITOR) 10 MG tablet Take 10 mg by mouth 3 (three) times a week. Mon / Wed / Fri    [provider]  calcium carbonate (TUMS - DOSED IN MG ELEMENTAL CALCIUM) 500 MG chewable tablet Chew 1 tablet by mouth daily as needed for indigestion or heartburn.    [provider]  Calcium-Vitamin D (CALTRATE 600 PLUS-VIT D PO) Take 1 tablet by mouth daily.    [provider]  cephALEXin (KEFLEX) 250 MG capsule Take 250 mg by mouth daily as needed (after intercourse).    [provider]  conjugated estrogens (PREMARIN) vaginal cream Place  1 Applicatorful vaginally 3 (three) times a week. Mon / Wed / Fri    [provider]  Cranberry 1000 MG CAPS Take 1,000 mg by mouth 2 (two) times daily.    [provider]  diazepam (VALIUM) 10 MG tablet Take 10 mg by mouth every 6 (six) hours as needed for anxiety.    [provider]  docusate sodium (COLACE) 100 MG capsule Take 100 mg by mouth daily as needed for mild constipation.    [provider]  esomeprazole (NEXIUM) 40 MG capsule Take 40 mg by mouth daily at 12 noon.    [provider]  estradiol (VIVELLE-DOT) 0.025 MG/24HR Place 1 patch onto the skin 2 (two) times a week. Sat / Wed    [provider]  HYDROcodone-acetaminophen (NORCO) 10-325 MG per tablet Take 1 tablet by mouth every 6 (six) hours as needed for moderate pain.     [provider]  levothyroxine (SYNTHROID, LEVOTHROID) 88 MCG tablet Take 88 mcg by mouth daily before breakfast.    [provider]  Misc Natural Products (OSTEO BI-FLEX ADV DOUBLE ST PO) Take 1 tablet by mouth 2 (two) times daily.     [provider]  Multiple Vitamins-Minerals (EYE VITAMINS) CAPS Take 1 capsule by mouth 2 (two) times daily.    [provider]  oxyCODONE-acetaminophen (PERCOCET/ROXICET) 5-325 MG per tablet Take 2 tablets by mouth every 4 (four) hours as needed for severe pain. Patient not  taking: Reported on 04/24/2017 07/04/14   Patel-Mills, Lorelle Formosa, PA-C  saccharomyces boulardii (FLORASTOR) 250 MG capsule Take 250 mg by mouth daily.    [provider]  valsartan (DIOVAN) 80 MG tablet Take 80 mg by mouth daily.    [provider]     Family History  Problem Relation Age of Onset  . Breast cancer Maternal Aunt   . Breast cancer Paternal Aunt   . Breast cancer Paternal Aunt     Social History   Socioeconomic History  . Marital status: Married    Spouse name: Not on file  . Number of children: Not on file  . Years of education: Not  on file  . Highest education level: Not on file  Occupational History  . Not on file  Social Needs  . Financial resource strain: Not on file  . Food insecurity:    Worry: Not on file    Inability: Not on file  . Transportation needs:    Medical: Not on file    Non-medical: Not on file  Tobacco Use  . Smoking status: Former Smoker    Packs/day: 1.00    Types: Cigarettes    Start date: 04/13/1972    Last attempt to quit: 04/14/1979    Years since quitting: 38.2  . Smokeless tobacco: Never Used  Substance and Sexual Activity  . Alcohol use: No    Alcohol/week: 0.0 oz  . Drug use: Not on file  . Sexual activity: Not on file  Lifestyle  . Physical activity:    Days per week: Not on file    Minutes per session: Not on file  . Stress: Not on file  Relationships  . Social connections:    Talks on phone: Not on file    Gets together: Not on file    Attends religious service: Not on file    Active member of club or organization: Not on file    Attends meetings of clubs or organizations: Not on file    Relationship status: Not on file  Other Topics Concern  . Not on file  Social History Narrative  . Not on file      Review of Systems: A 12 point ROS discussed and pertinent positives are indicated in the HPI above.  All other systems are negative.  Review of Systems  Vital Signs: There were no vitals taken for this visit.  Physical Exam  Constitutional: She appears well-developed and well-nourished. No distress.  Musculoskeletal: Normal range of motion. She exhibits tenderness. She exhibits no edema.  Left lower extremity demonstrates patchy scattered bruising along the treated segment of the left GSV.  Mild tenderness in the proximal medial thigh region over the thrombosed vein.  Skin entry site in the lower calf region is well-healed.  No signs of cellulitis.  No drainage.  Skin intact.  Skin: She is not diaphoretic.     Imaging: US Venous Img Lower Unilateral  Left  Result Date: 06/17/2017 CLINICAL DATA:  One week status post left GSV transcatheter laser occlusion for varicose veins, venous insufficiency and leg pain EXAM: LEFT LOWER EXTREMITY VENOUS DOPPLER ULTRASOUND TECHNIQUE: Gray-scale sonography with graded compression, as well as color Doppler and duplex ultrasound were performed to evaluate the lower extremity deep venous systems from the level of the common femoral vein and including the common femoral, femoral, profunda femoral, popliteal and calf veins including the posterior tibial, peroneal and gastrocnemius veins when visible. The superficial great saphenous vein was also interrogated.  Spectral Doppler was utilized to evaluate flow at rest and with distal augmentation maneuvers in the common femoral, femoral and popliteal veins. COMPARISON:  None. FINDINGS: Contralateral Common Femoral Vein: Respiratory phasicity is normal and symmetric with the symptomatic side. No evidence of thrombus. Normal compressibility. Common Femoral Vein: No evidence of thrombus. Normal compressibility, respiratory phasicity and response to augmentation. Saphenofemoral Junction: No evidence of thrombus. Normal compressibility and flow on color Doppler imaging. Profunda Femoral Vein: No evidence of thrombus. Normal compressibility and flow on color Doppler imaging. Femoral Vein: No evidence of thrombus. Normal compressibility, respiratory phasicity and response to augmentation. Popliteal Vein: No evidence of thrombus. Normal compressibility, respiratory phasicity and response to augmentation. Calf Veins: No evidence of thrombus. Normal compressibility and flow on color Doppler imaging. Superficial Great Saphenous Vein: Left GSV treated segment is occluded from just inferior to the saphenous femoral junction into the lower calf region. Calf branching varicosities are also occluded. Venous Reflux:  None. Other Findings:  None. IMPRESSION: Negative for DVT in the left lower  extremity. Left GSV treated segment remains occluded at 1 week. Calf branching varicosities also occluded. Electronically Signed   By: Judie Petit.  Markiah Janeway M.D.   On: 06/17/2017 12:07   [place] Rad Eval And Mgmt  Result Date: 06/17/2017 Please refer to "Notes" to see consult details.  Ir Embo Venous Not Hemorr Hemang  Inc Guide Roadmapping  Result Date: 06/10/2017 CLINICAL DATA:  62 year old with symptomatic bilateral lower extremity venous insufficiency. Venous insufficiency in the bilateral great saphenous veins. Plan for treatment of the left great saphenous vein today. EXAM: ENDOVASCULAR LASER TREATMENT TO THE LEFT GREAT SAPHENOUS VEIN. Physician: Rachelle Hora. Lowella Dandy, MD MEDICATIONS: MEDICATIONS None. ANESTHESIA/SEDATION: Valium 10 mg p.o. COMPLICATIONS: None immediate. PROCEDURE: The procedure was explained to the patient. The risks and benefits of the procedure were discussed and that patient's questions were addressed. Informed consent was obtained from the patient. The patient was placed in a supine position. The left lower extremity superficial venous system was evaluated with ultrasound. The left great saphenous vein was identified and the vein path was marked on the skin. The left leg and left groin were prepped with chlorhexidine and a sterile drape was placed. The skin below the left knee was anesthetized with 1% lidocaine. A 21 gauge needle was directed into the great saphenous vein with ultrasound guidance and a micropuncture dilator set was placed. Access site was in the mid/lower left calf. Wire was advanced into the proximal left great saphenous vein. A 0.035 wire was advanced to the saphenofemoral junction with ultrasound guidance. A 65 cm sheath was placed over the wire and positioned distal to the saphenofemoral junction. Dilute lidocaine was used as a tumescent. The great saphenous vein from the percutaneous entry site to the saphenofemoral junction was treated with 475 ml of the tumescent. The laser treatment  was performed with 2009 joules over 335 seconds. The sheath and laser were removed as a single entity. Manual compression was placed over the percutaneous entry site. Dressings were placed over the puncture sites and the leg was placed in a compression stocking. The great saphenous vein was accessed below the knee and the laser was positioned 2 cm from the saphenofemoral junction. IMPRESSION: Successful transcatheter laser occlusion of the left great saphenous vein with ultrasound guidance. Electronically Signed   By: Richarda Overlie M.D.   On: 06/10/2017 11:35    Labs:  CBC: No results for input(s): WBC, HGB, HCT, PLT in the last 8760 hours.  COAGS: No  results for input(s): INR, APTT in the last 8760 hours.  BMP: No results for input(s): NA, K, CL, CO2, GLUCOSE, BUN, CALCIUM, CREATININE, GFRNONAA, GFRAA in the last 8760 hours.  Invalid input(s): CMP  LIVER FUNCTION TESTS: No results for input(s): BILITOT, AST, ALT, ALKPHOS, PROT, ALBUMIN in the last 8760 hours.  TUMOR MARKERS: No results for input(s): AFPTM, CEA, CA199, CHROMGRNA in the last 8760 hours.  Assessment and Plan:  Doing well 1 week status post left GSV transcatheter laser occlusion.  Ultrasound confirms thrombosis of the treated segment as well as the calf varicosities.  No early recanalization or delayed complication.  Negative for DVT.  Overall she is doing very well.  Plan: Continue daily walking and compression stockings.  Outpatient follow-up in 1 month with a repeat ultrasound.    Electronically Signed: Berdine Dance 06/17/2017, 12:12 PM   I spent a total of    15 Minutes in face to face in clinical consultation, greater than 50% of which was counseling/coordinating care for this patient with venous insufficiency.

## 2017-06-24 ENCOUNTER — Other Ambulatory Visit (HOSPITAL_COMMUNITY): Payer: Self-pay | Admitting: Diagnostic Radiology

## 2017-06-24 DIAGNOSIS — I872 Venous insufficiency (chronic) (peripheral): Secondary | ICD-10-CM

## 2017-07-22 ENCOUNTER — Ambulatory Visit
Admission: RE | Admit: 2017-07-22 | Discharge: 2017-07-22 | Disposition: A | Discharge status: Home / Self Care | Payer: Managed Care, Other (non HMO) | Source: Ambulatory Visit | Attending: Diagnostic Radiology | Admitting: Diagnostic Radiology

## 2017-07-22 ENCOUNTER — Other Ambulatory Visit (HOSPITAL_COMMUNITY): Payer: Self-pay | Admitting: Diagnostic Radiology

## 2017-07-22 ENCOUNTER — Encounter: Payer: Self-pay | Admitting: Diagnostic Radiology

## 2017-07-22 DIAGNOSIS — I872 Venous insufficiency (chronic) (peripheral): Secondary | ICD-10-CM

## 2017-07-22 HISTORY — PX: IR EMBO VENOUS NOT HEMORR HEMANG  INC GUIDE ROADMAPPING: IMG5447

## 2017-07-22 NOTE — Progress Notes (Signed)
Chief Complaint: Patient was seen in consultation today for  Chief Complaint  Patient presents with  . Follow-up    EVLT of GSV of Right Leg with Possible Sclerotherapy   at the request of Skarleth Delmonico  Referring Physician(s): Nicholos Johns, A  History of Present Illness: Diana Mcpherson is a 62 y.o. female with bilateral lower extremity venous insufficiency.  Patient underwent endovascular laser occlusion of the left great saphenous vein on 06/10/2017.  She has been wearing her compression stockings regularly.  Overall, the symptoms in the left lower extremity have decreased.  She continues to have intermittent pain in the left calf.  The bruising and tenderness in the left lower extremity has decreased since the procedure.  She denies fevers or chills.  She recently dropped an object on her left foot near the first and second toes.  She had bleeding from this area and was seen in urgent care.  She continues to have a small hematoma with tenderness in this area.  No additional bleeding.  Past Medical History:  Diagnosis Date  . Hiatal hernia   . Hypertension   . Hypothyroidism due to Hashimoto's thyroiditis   . Macular degeneration disease   . Osteoarthritis     Past Surgical History:  Procedure Laterality Date  . APPENDECTOMY    . BACK SURGERY    . BREAST EXCISIONAL BIOPSY Left   . CHOLECYSTECTOMY    . IR EMBO VENOUS NOT HEMORR HEMANG  INC GUIDE ROADMAPPING  06/10/2017  . IR EMBO VENOUS NOT HEMORR HEMANG  INC GUIDE ROADMAPPING  07/22/2017  . TONSILLECTOMY    . VAGINAL HYSTERECTOMY      Allergies: Contrast media [iodinated diagnostic agents]; Cymbalta [duloxetine hcl]; Gabapentin; Relafen [nabumetone]; Gadolinium derivatives; and Morphine and related  Medications: Prior to Admission medications   Medication Sig Start Date End Date Taking? Authorizing Provider  atorvastatin (LIPITOR) 10 MG tablet Take 10 mg by mouth 3 (three) times a week. Mon / Wed / Fri   Yes [provider]  calcium carbonate (TUMS - DOSED IN MG ELEMENTAL CALCIUM) 500 MG chewable tablet Chew 1 tablet by mouth daily as needed for indigestion or heartburn.   Yes [provider]  Calcium-Vitamin D (CALTRATE 600 PLUS-VIT D PO) Take 1 tablet by mouth daily.   Yes [provider]  cephALEXin (KEFLEX) 250 MG capsule Take 250 mg by mouth daily as needed (after intercourse).   Yes [provider]  conjugated estrogens (PREMARIN) vaginal cream Place 1 Applicatorful vaginally 3 (three) times a week. Mon / Wed / Fri   Yes [provider]  diazepam (VALIUM) 10 MG tablet Take 10 mg by mouth every 6 (six) hours as needed for anxiety.   Yes [provider]  docusate sodium (COLACE) 100 MG capsule Take 100 mg by mouth daily as needed for mild constipation.   Yes [provider]  esomeprazole (NEXIUM) 40 MG capsule Take 40 mg by mouth daily at 12 noon.   Yes [provider]  estradiol (VIVELLE-DOT) 0.025 MG/24HR Place 1 patch onto the skin 2 (two) times a week. Sat / Wed   Yes [provider]  HYDROcodone-acetaminophen (NORCO) 10-325 MG per tablet Take 1 tablet by mouth every 6 (six) hours as needed for moderate pain.    Yes [provider]  levothyroxine (SYNTHROID, LEVOTHROID) 88 MCG tablet Take 88 mcg by mouth daily before breakfast.   Yes [provider]  Multiple Vitamins-Minerals (EYE VITAMINS) CAPS Take 1 capsule  by mouth 2 (two) times daily.   Yes [provider]  saccharomyces boulardii (FLORASTOR) 250 MG capsule Take 250 mg by mouth daily.   Yes [provider]  Cranberry 1000 MG CAPS Take 1,000 mg by mouth 2 (two) times daily.    [provider]  Misc Natural Products (OSTEO BI-FLEX ADV DOUBLE ST PO) Take 1 tablet by mouth 2 (two) times daily.     [provider]  oxyCODONE-acetaminophen (PERCOCET/ROXICET) 5-325 MG per tablet Take 2 tablets by mouth every 4 (four) hours as  needed for severe pain. Patient not taking: Reported on 04/24/2017 07/04/14   Patel-Mills, Lorelle FormosaHanna, PA-C  valsartan (DIOVAN) 80 MG tablet Take 80 mg by mouth daily.    [provider]     Family History  Problem Relation Age of Onset  . Breast cancer Maternal Aunt   . Breast cancer Paternal Aunt   . Breast cancer Paternal Aunt     Social History   Socioeconomic History  . Marital status: Married    Spouse name: Not on file  . Number of children: Not on file  . Years of education: Not on file  . Highest education level: Not on file  Occupational History  . Not on file  Social Needs  . Financial resource strain: Not on file  . Food insecurity:    Worry: Not on file    Inability: Not on file  . Transportation needs:    Medical: Not on file    Non-medical: Not on file  Tobacco Use  . Smoking status: Former Smoker    Packs/day: 1.00    Types: Cigarettes    Start date: 04/13/1972    Last attempt to quit: 04/14/1979    Years since quitting: 38.2  . Smokeless tobacco: Never Used  Substance and Sexual Activity  . Alcohol use: No    Alcohol/week: 0.0 oz  . Drug use: Not on file  . Sexual activity: Not on file  Lifestyle  . Physical activity:    Days per week: Not on file    Minutes per session: Not on file  . Stress: Not on file  Relationships  . Social connections:    Talks on phone: Not on file    Gets together: Not on file    Attends religious service: Not on file    Active member of club or organization: Not on file    Attends meetings of clubs or organizations: Not on file    Relationship status: Not on file  Other Topics Concern  . Not on file  Social History Narrative  . Not on file     Review of Systems  Vital Signs: BP (!) 141/70   Pulse 66   Temp 97.8 F (36.6 C) (Oral)   Resp 15   Ht 5\' 6"  (1.676 m)   Wt 190 lb (86.2 kg)   SpO2 100%   BMI 30.67 kg/m   Physical Exam  Musculoskeletal: She exhibits tenderness. She exhibits no edema.  The  access site in the left calf is well-healed.  There is some mild bruising in the medial left lower thigh.  Patient has mild tenderness in the left calf but no visible varicosities on physical examination.  No large visible varicosities in the right leg on today's examination.        Imaging: [place] Venous Img Lower Unilateral Left  Result Date: 07/22/2017 CLINICAL DATA:  62 year old with superficial venous insufficiency in the left lower extremity. Patient underwent  endovascular laser treatment to the left great saphenous vein on 06/10/2017. Patient presents for routine follow-up visit. Overall, symptoms in the left lower extremity have markedly decreased. Patient continues to have intermittent left calf pain. EXAM: LEFT LOWER EXTREMITY VENOUS DOPPLER ULTRASOUND TECHNIQUE: Gray-scale sonography with graded compression, as well as color Doppler and duplex ultrasound were performed to evaluate the lower extremity deep venous systems from the level of the common femoral vein and including the common femoral, femoral, profunda femoral, popliteal and calf veins including the posterior tibial, peroneal and gastrocnemius veins when visible. The superficial great saphenous vein was also interrogated. Spectral Doppler was utilized to evaluate flow at rest and with distal augmentation maneuvers in the common femoral, femoral and popliteal veins. COMPARISON:  06/17/2017 FINDINGS: Common Femoral Vein: No evidence of thrombus. Normal compressibility, respiratory phasicity and response to augmentation. Saphenofemoral Junction: No evidence of thrombus. Normal compressibility and flow on color Doppler imaging. Profunda Femoral Vein: No evidence of thrombus. Normal compressibility and flow on color Doppler imaging. Femoral Vein: No evidence of thrombus. Normal compressibility, respiratory phasicity and response to augmentation. Popliteal Vein: No evidence of thrombus. Normal compressibility, respiratory phasicity and response  to augmentation. Calf Veins: No evidence of thrombus. Normal compressibility in the left posterior tibial veins. Superficial Great Saphenous Vein: The proximal thigh great saphenous vein is compressible and patent. The proximal/mid great saphenous vein is not compressible and thrombosed. The distal GSV is noncompressible and thrombosed. The great saphenous vein is occluded in the proximal and mid calf. Distal calf left great saphenous vein is patent and compressible. There is a branch of the proximal by GSV which is open and tracks posteriorly and may be associated with vein of Giacomini. Other Findings: Small varicosities in the mid and distal left calf associated with a perforator branch and also connects to the thrombosed segment of the mid calf GSV. Patient says the location of these varicosities correspond with her intermittent pain. IMPRESSION: Negative for deep vein thrombosis in the left lower extremity. 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 The proximal thigh great saphenous vein is patent and associated with a branch that tracks into the posterior thigh. Small varicosities in the medial left calf associated with a perforator branch and the left GSV. Electronically Signed   By: Richarda Overlie M.D.   On: 07/22/2017 11:44   [place] Injec Sclerotherapy Single  Result Date: 07/22/2017 CLINICAL DATA:  62 year old with bilateral lower extremity venous insufficiency. Previous endovascular laser occlusion of the left great saphenous vein. Patient has symptomatic varicosities in the medial left calf. These varicosities are associated with a perforator branch in the left calf. EXAM: ULTRASOUND-GUIDED FOAM SCLEROTHERAPY TO VARICOSE VEIN IN LEFT LOWER EXTREMITY Physician: Rachelle Hora. Lowella Dandy, MD MEDICATIONS: MEDICATIONS None. ANESTHESIA/SEDATION: None COMPLICATIONS: None immediate. PROCEDURE: The right great saphenous vein was treated with endovascular laser occlusion prior to this procedure. Immediately  following the endovascular laser treatment to the right lower extremity, the left medial calf was prepped with chlorhexidine. 1% Polidocanol was made into a foam with a sclerosant to air ration of 1:3. Approximately 1.5 mL of Polidocanol was used for this procedure. Using ultrasound guidance, the foam sclerosant was injected into the small varicose vein with ultrasound guidance. Initially, a small amount of sclerosant was extravascular. However, the needle was confirmed within the vein and the sclerosant was successfully injected into the small varicose vein. Small bandage placed at the puncture site. IMPRESSION: Successful ultrasound-guided foam sclerotherapy to a varicose vein in the  medial left calf. Electronically Signed   By: Richarda Overlie M.D.   On: 07/22/2017 12:31   [place] Rad Eval And Mgmt  Result Date: 07/22/2017 Please refer to "Notes" to see consult details.  Ir Embo Venous Not Hemorr Hemang  Inc Guide Roadmapping  Result Date: 07/22/2017 CLINICAL DATA:  62 year old with symptomatic right lower extremity venous insufficiency. Patient presents for endovascular laser treatment to the right great saphenous vein. EXAM: ENDOVASCULAR LASER TREATMENT TO THE RIGHT GREAT SAPHENOUS VEIN. Physician: Rachelle Hora. Lowella Dandy, MD MEDICATIONS: MEDICATIONS None. ANESTHESIA/SEDATION: Valium 13 mg oral COMPLICATIONS: None immediate. PROCEDURE: The procedure was explained to the patient. The risks and benefits of the procedure were discussed and that patient's questions were addressed. Informed consent was obtained from the patient. The patient was placed in a supine position. The right lower extremity superficial venous system was evaluated with ultrasound. The right great saphenous vein was identified and the vein path was marked on the skin. The right leg and right groin were prepped with chlorhexidine and a sterile drape was placed. The skin in the mid/lower calf was anesthetized with 1% lidocaine. A 21 gauge needle was directed  into the great saphenous vein with ultrasound guidance and a micropuncture dilator set was placed. Wire was advanced into the proximal right great saphenous vein. A 0.035 wire was advanced to the saphenofemoral junction with ultrasound guidance. A 65 cm sheath was placed over the wire and positioned distal to the saphenofemoral junction. Dilute lidocaine was used as a tumescent. The great saphenous vein from the percutaneous entry site to the saphenofemoral junction was treated with 500 ml of the tumescent. The laser treatment was performed with 2119 joules over 353 seconds. The sheath and laser were removed as a single entity. Manual compression was placed over the percutaneous entry site. Dressings were placed over the puncture sites and the leg was placed in a compression stocking. The great saphenous vein was accessed in the mid calf and the laser was positioned approximately 2 cm from the saphenofemoral junction. IMPRESSION: Successful transcatheter laser occlusion of the right great saphenous vein with ultrasound guidance. Electronically Signed   By: Richarda Overlie M.D.   On: 07/22/2017 11:58    Labs:  CBC: No results for input(s): WBC, HGB, HCT, PLT in the last 8760 hours.  COAGS: No results for input(s): INR, APTT in the last 8760 hours.  BMP: No results for input(s): NA, K, CL, CO2, GLUCOSE, BUN, CALCIUM, CREATININE, GFRNONAA, GFRAA in the last 8760 hours.  Invalid input(s): CMP  LIVER FUNCTION TESTS: No results for input(s): BILITOT, AST, ALT, ALKPHOS, PROT, ALBUMIN in the last 8760 hours.  TUMOR MARKERS: No results for input(s): AFPTM, CEA, CA199, CHROMGRNA in the last 8760 hours.  Assessment and Plan:  62 year old with bilateral lower extremity venous insufficiency.  The left great saphenous vein was treated with endovascular laser occlusion on 06/10/2017.  Patient tolerated the procedure well.  Ultrasound examination today demonstrates that the majority of the treated left great  saphenous vein is occluded.  However, the proximal thigh great saphenous vein is patent and there is a prominent branch vessel coming off the proximal thigh great saphenous vein that extends posteriorly and may be associated with the vein of Giacomini.  There are small varicosities in the medial left calf associated with a perforator branch.  This medial calf area is symptomatic according to the patient.  These small varicosities in the medial left calf are amenable to ultrasound-guided sclerotherapy.  The patient underwent endovascular  laser treatment to the great saphenous vein without complication.  No sclerotherapy was performed in the right lower extremity.  Ultrasound guided foam sclerotherapy was performed to the small varicosities in the medial left calf.  Patient tolerated the sclerotherapy well.  Both legs were placed in compression garments after the procedures and patient walked following the procedures.   Patient will follow-up in 1 week for bilateral lower extremity ultrasound and follow-up examination.  Patient will continue to wear the compression stockings regularly as she did following the left leg treatment.  Plan to keep close attention on the proximal thigh left great saphenous vein.  If there are signs of increased recanalization of the left thigh great saphenous vein, we may need to consider retreatment of the left great saphenous vein and the branch vessel in this area.    Electronically Signed: Arn Medal 07/22/2017, 12:38 PM   I spent a total of    15 Minutes in face to face in clinical consultation, greater than 50% of which was counseling/coordinating care for venous insufficiency.   Patient ID: Diana Mcpherson, female   DOB: 07-08-1955, 62 y.o.   MRN: [id number]

## 2017-07-22 NOTE — Progress Notes (Signed)
0900  Informed Consent obtained for EVLT of GSV of Right Leg with possible Sclerotherapy.  Given verbal and written discharge instructions.    0910  22 gauge x 1" insyte catheter started IV Left antecubital (1st attempt) w/ Saline lock and 7" extension.  Flushed with 10 mL NSS without difficulty.   Patient brought meds from home:  Valium 12.5 mg po.     0940  Procedure started.    1125  Procedure completed.  Patient wearing thigh high graduated compression garment (20-30 mm Hg) on Right Leg.  1135  IV d/c'd Left antecubital, catheter intact.  Site "unremarkable".    1140  Ambulated without assistance x 10 minutes,  Gait steady.  Tolerated well.  1150  Patient's husband to provide transportation to home.  Discharged to home.    Johnica Armwood Carmell AustriaGales, RN 07/22/2017 11:48 AM

## 2017-07-29 ENCOUNTER — Ambulatory Visit
Admission: RE | Admit: 2017-07-29 | Discharge: 2017-07-29 | Disposition: A | Discharge status: Home / Self Care | Payer: Managed Care, Other (non HMO) | Source: Ambulatory Visit | Attending: Diagnostic Radiology | Admitting: Diagnostic Radiology

## 2017-07-29 DIAGNOSIS — I872 Venous insufficiency (chronic) (peripheral): Secondary | ICD-10-CM

## 2017-07-29 NOTE — Progress Notes (Signed)
Patient ID: Diana Mcpherson, female   DOB: [birth year], 62 y.o.   MRN: [id number]       Chief Complaint: Patient was seen in consultation today for Follow-up vein treatment at the request of Henn,Adam  Referring Physician(s): Henn,Adam  History of Present Illness: Diana Mcpherson is a 62 y.o. female presented with bilateral lower extremity venous insufficiency. Laser occlusion of left greater saphenous vein on 06/10/2017 Right great saphenous vein endovascular laser ablation 07/22/2017 Ultrasound-guided foam  sclerotherapy of residual symptomatic varicose veins in the left lower extremity 07/22/2017  She returns for her scheduled one week follow-up.  She has done well post procedure.  She remains active.  She uses her compression hose as recommended.  Usual tenderness along the sites of sclerotherapy in the medial left calf, and along the left great saphenous vein centrally.  Little significant tenderness along the right great saphenous vein treatment region.  No skin changes.  No numbness.  Past Medical History:  Diagnosis Date  . Hiatal hernia   . Hypertension   . Hypothyroidism due to Hashimoto's thyroiditis   . Macular degeneration disease   . Osteoarthritis     Past Surgical History:  Procedure Laterality Date  . APPENDECTOMY    . BACK SURGERY    . BREAST EXCISIONAL BIOPSY Left   . CHOLECYSTECTOMY    . IR EMBO VENOUS NOT HEMORR HEMANG  INC GUIDE ROADMAPPING  06/10/2017  . IR EMBO VENOUS NOT HEMORR HEMANG  INC GUIDE ROADMAPPING  07/22/2017  . TONSILLECTOMY    . VAGINAL HYSTERECTOMY      Allergies: Contrast media [iodinated diagnostic agents]; Cymbalta [duloxetine hcl]; Gabapentin; Relafen [nabumetone]; Gadolinium derivatives; and Morphine and related  Medications: Prior to Admission medications   Medication Sig Start Date End Date Taking? Authorizing Provider  atorvastatin (LIPITOR) 10 MG tablet Take 10 mg by mouth 3 (three) times a week. Mon / Wed / Fri    [provider]    calcium carbonate (TUMS - DOSED IN MG ELEMENTAL CALCIUM) 500 MG chewable tablet Chew 1 tablet by mouth daily as needed for indigestion or heartburn.    [provider]  Calcium-Vitamin D (CALTRATE 600 PLUS-VIT D PO) Take 1 tablet by mouth daily.    [provider]  cephALEXin (KEFLEX) 250 MG capsule Take 250 mg by mouth daily as needed (after intercourse).    [provider]  conjugated estrogens (PREMARIN) vaginal cream Place 1 Applicatorful vaginally 3 (three) times a week. Mon / Wed / Fri    [provider]  Cranberry 1000 MG CAPS Take 1,000 mg by mouth 2 (two) times daily.    [provider]  diazepam (VALIUM) 10 MG tablet Take 10 mg by mouth every 6 (six) hours as needed for anxiety.    [provider]  docusate sodium (COLACE) 100 MG capsule Take 100 mg by mouth daily as needed for mild constipation.    [provider]  esomeprazole (NEXIUM) 40 MG capsule Take 40 mg by mouth daily at 12 noon.    [provider]  estradiol (VIVELLE-DOT) 0.025 MG/24HR Place 1 patch onto the skin 2 (two) times a week. Sat / Wed    [provider]  HYDROcodone-acetaminophen (NORCO) 10-325 MG per tablet Take 1 tablet by mouth every 6 (six) hours as needed for moderate pain.     [provider]  levothyroxine (SYNTHROID, LEVOTHROID) 88 MCG tablet Take 88 mcg by mouth daily before breakfast.    [provider]  Misc  Natural Products (OSTEO BI-FLEX ADV DOUBLE ST PO) Take 1 tablet by mouth 2 (two) times daily.     [provider]  Multiple Vitamins-Minerals (EYE VITAMINS) CAPS Take 1 capsule by mouth 2 (two) times daily.    [provider]  oxyCODONE-acetaminophen (PERCOCET/ROXICET) 5-325 MG per tablet Take 2 tablets by mouth every 4 (four) hours as needed for severe pain. Patient not taking: Reported on 04/24/2017 07/04/14   Patel-Mills, Lorelle Formosa, PA-C  saccharomyces boulardii (FLORASTOR) 250 MG capsule Take  250 mg by mouth daily.    [provider]  valsartan (DIOVAN) 80 MG tablet Take 80 mg by mouth daily.    [provider]     Family History  Problem Relation Age of Onset  . Breast cancer Maternal Aunt   . Breast cancer Paternal Aunt   . Breast cancer Paternal Aunt     Social History   Socioeconomic History  . Marital status: Married    Spouse name: Not on file  . Number of children: Not on file  . Years of education: Not on file  . Highest education level: Not on file  Occupational History  . Not on file  Social Needs  . Financial resource strain: Not on file  . Food insecurity:    Worry: Not on file    Inability: Not on file  . Transportation needs:    Medical: Not on file    Non-medical: Not on file  Tobacco Use  . Smoking status: Former Smoker    Packs/day: 1.00    Types: Cigarettes    Start date: 04/13/1972    Last attempt to quit: 04/14/1979    Years since quitting: 38.3  . Smokeless tobacco: Never Used  Substance and Sexual Activity  . Alcohol use: No    Alcohol/week: 0.0 oz  . Drug use: Not on file  . Sexual activity: Not on file  Lifestyle  . Physical activity:    Days per week: Not on file    Minutes per session: Not on file  . Stress: Not on file  Relationships  . Social connections:    Talks on phone: Not on file    Gets together: Not on file    Attends religious service: Not on file    Active member of club or organization: Not on file    Attends meetings of clubs or organizations: Not on file    Relationship status: Not on file  Other Topics Concern  . Not on file  Social History Narrative  . Not on file    ECOG Status: 1 - Symptomatic but completely ambulatory   Physical Exam  Vital Signs: There were no vitals taken for this visit.  Constitutional: Oriented to person, place, and time. Well-developed and well-nourished. No distress.  HENT:  Head: Normocephalic and atraumatic.  Eyes: Conjunctivae and EOM are normal.  Right eye exhibits no discharge. Left eye exhibits no discharge. No scleral icterus.  Neck: No JVD present.  Pulmonary/Chest: Effort normal. No stridor. No respiratory distress.  Abdomen: soft, non distended Neurological:  alert and oriented to person, place, and time.  Skin: Skin is warm and dry.  not diaphoretic.   Some  bruising along the course of the right great saphenous vein typical post treatment.  No other skin changes.  No significant tenderness. Psychiatric:   normal mood and affect.   behavior is normal. Judgment and thought content normal.   Review of Systems  Review of Systems: A 12 point ROS  discussed and pertinent positives are indicated in the HPI above.  All other systems are negative.   Mallampati Score:     Imaging: US Venous Img Lower Unilateral Left  Result Date: 07/22/2017 CLINICAL DATA:  62 year old with superficial venous insufficiency in the left lower extremity. Patient underwent endovascular laser treatment to the left great saphenous vein on 06/10/2017. Patient presents for routine follow-up visit. Overall, symptoms in the left lower extremity have markedly decreased. Patient continues to have intermittent left calf pain. EXAM: LEFT LOWER EXTREMITY VENOUS DOPPLER ULTRASOUND TECHNIQUE: Gray-scale sonography with graded compression, as well as color Doppler and duplex ultrasound were performed to evaluate the lower extremity deep venous systems from the level of the common femoral vein and including the common femoral, femoral, profunda femoral, popliteal and calf veins including the posterior tibial, peroneal and gastrocnemius veins when visible. The superficial great saphenous vein was also interrogated. Spectral Doppler was utilized to evaluate flow at rest and with distal augmentation maneuvers in the common femoral, femoral and popliteal veins. COMPARISON:  06/17/2017 FINDINGS: Common Femoral Vein: No evidence of thrombus. Normal compressibility, respiratory  phasicity and response to augmentation. Saphenofemoral Junction: No evidence of thrombus. Normal compressibility and flow on color Doppler imaging. Profunda Femoral Vein: No evidence of thrombus. Normal compressibility and flow on color Doppler imaging. Femoral Vein: No evidence of thrombus. Normal compressibility, respiratory phasicity and response to augmentation. Popliteal Vein: No evidence of thrombus. Normal compressibility, respiratory phasicity and response to augmentation. Calf Veins: No evidence of thrombus. Normal compressibility in the left posterior tibial veins. Superficial Great Saphenous Vein: The proximal thigh great saphenous vein is compressible and patent. The proximal/mid great saphenous vein is not compressible and thrombosed. The distal GSV is noncompressible and thrombosed. The great saphenous vein is occluded in the proximal and mid calf. Distal calf left great saphenous vein is patent and compressible. There is a branch of the proximal by GSV which is open and tracks posteriorly and may be associated with vein of Giacomini. Other Findings: Small varicosities in the mid and distal left calf associated with a perforator branch and also connects to the thrombosed segment of the mid calf GSV. Patient says the location of these varicosities correspond with her intermittent pain. IMPRESSION: Negative for deep vein thrombosis in the left lower extremity. 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 The proximal thigh great saphenous vein is patent and associated with a branch that tracks into the posterior thigh. Small varicosities in the medial left calf associated with a perforator branch and the left GSV. Electronically Signed   By: Richarda Overlie M.D.   On: 07/22/2017 11:44   [place] Injec Sclerotherapy Single  Result Date: 07/22/2017 CLINICAL DATA:  62 year old with bilateral lower extremity venous insufficiency. Previous endovascular laser occlusion of the left great saphenous  vein. Patient has symptomatic varicosities in the medial left calf. These varicosities are associated with a perforator branch in the left calf. EXAM: ULTRASOUND-GUIDED FOAM SCLEROTHERAPY TO VARICOSE VEIN IN LEFT LOWER EXTREMITY Physician: Rachelle Hora. Lowella Dandy, MD MEDICATIONS: MEDICATIONS None. ANESTHESIA/SEDATION: None COMPLICATIONS: None immediate. PROCEDURE: The right great saphenous vein was treated with endovascular laser occlusion prior to this procedure. Immediately following the endovascular laser treatment to the right lower extremity, the left medial calf was prepped with chlorhexidine. 1% Polidocanol was made into a foam with a sclerosant to air ration of 1:3. Approximately 1.5 mL of Polidocanol was used for this procedure. Using ultrasound guidance, the foam sclerosant was injected into the small  varicose vein with ultrasound guidance. Initially, a small amount of sclerosant was extravascular. However, the needle was confirmed within the vein and the sclerosant was successfully injected into the small varicose vein. Small bandage placed at the puncture site. IMPRESSION: Successful ultrasound-guided foam sclerotherapy to a varicose vein in the medial left calf. Electronically Signed   By: Richarda OverlieAdam  Henn M.D.   On: 07/22/2017 12:31   [place] Rad Eval And Mgmt  Result Date: 07/22/2017 Please refer to "Notes" to see consult details.  Ir Embo Venous Not Hemorr Hemang  Inc Guide Roadmapping  Result Date: 07/22/2017 CLINICAL DATA:  62 year old with symptomatic right lower extremity venous insufficiency. Patient presents for endovascular laser treatment to the right great saphenous vein. EXAM: ENDOVASCULAR LASER TREATMENT TO THE RIGHT GREAT SAPHENOUS VEIN. Physician: Rachelle HoraAdam R. Lowella DandyHenn, MD MEDICATIONS: MEDICATIONS None. ANESTHESIA/SEDATION: Valium 13 mg oral COMPLICATIONS: None immediate. PROCEDURE: The procedure was explained to the patient. The risks and benefits of the procedure were discussed and that patient's questions  were addressed. Informed consent was obtained from the patient. The patient was placed in a supine position. The right lower extremity superficial venous system was evaluated with ultrasound. The right great saphenous vein was identified and the vein path was marked on the skin. The right leg and right groin were prepped with chlorhexidine and a sterile drape was placed. The skin in the mid/lower calf was anesthetized with 1% lidocaine. A 21 gauge needle was directed into the great saphenous vein with ultrasound guidance and a micropuncture dilator set was placed. Wire was advanced into the proximal right great saphenous vein. A 0.035 wire was advanced to the saphenofemoral junction with ultrasound guidance. A 65 cm sheath was placed over the wire and positioned distal to the saphenofemoral junction. Dilute lidocaine was used as a tumescent. The great saphenous vein from the percutaneous entry site to the saphenofemoral junction was treated with 500 ml of the tumescent. The laser treatment was performed with 2119 joules over 353 seconds. The sheath and laser were removed as a single entity. Manual compression was placed over the percutaneous entry site. Dressings were placed over the puncture sites and the leg was placed in a compression stocking. The great saphenous vein was accessed in the mid calf and the laser was positioned approximately 2 cm from the saphenofemoral junction. IMPRESSION: Successful transcatheter laser occlusion of the right great saphenous vein with ultrasound guidance. Electronically Signed   By: Richarda OverlieAdam  Henn M.D.   On: 07/22/2017 11:58    Labs:  CBC: No results for input(s): WBC, HGB, HCT, PLT in the last 8760 hours.  COAGS: No results for input(s): INR, APTT in the last 8760 hours.  BMP: No results for input(s): NA, K, CL, CO2, GLUCOSE, BUN, CALCIUM, CREATININE, GFRNONAA, GFRAA in the last 8760 hours.  Invalid input(s): CMP  LIVER FUNCTION TESTS: No results for input(s):  BILITOT, AST, ALT, ALKPHOS, PROT, ALBUMIN in the last 8760 hours.  TUMOR MARKERS: No results for input(s): AFPTM, CEA, CA199, CHROMGRNA in the last 8760 hours.  Assessment and Plan:  My impression is that she has done very well 1 week status post ultrasound-guided foam sclerotherapy of residual symptomatic left varicose veins, and endovascular laser ablation of the right great saphenous vein.  Usual postprocedure tenderness.  No redness, numbness, or other unexpected findings.  I discussed the expected postprocedure course and spectrum of response.  I reassured her that she is well within normal range and her course for good symptom relief ultimately.  I  encouraged continued use of the compression hose.  I encouraged continued exercise as previously discussed.  She seemed to understand, had her questions answered.  We will seek plan to see her back at the one month scheduled follow-up.  Thank you for this interesting consult.  I greatly enjoyed meeting Diana Mcpherson and look forward to participating in their care.  A copy of this report was sent to the requesting provider on this date.  Electronically Signed: Durwin Glaze 07/29/2017, 11:07 AM   I spent a total of    15 Minutes in face to face in clinical consultation, greater than 50% of which was counseling/coordinating care for symptomatic bilateral lower extremity varicose veins.

## 2017-08-26 ENCOUNTER — Other Ambulatory Visit: Payer: Managed Care, Other (non HMO)

## 2017-08-26 ENCOUNTER — Ambulatory Visit: Payer: Managed Care, Other (non HMO)

## 2017-08-28 ENCOUNTER — Ambulatory Visit
Admission: RE | Admit: 2017-08-28 | Discharge: 2017-08-28 | Disposition: A | Discharge status: Home / Self Care | Payer: Managed Care, Other (non HMO) | Source: Ambulatory Visit | Attending: Diagnostic Radiology | Admitting: Diagnostic Radiology

## 2017-08-28 ENCOUNTER — Ambulatory Visit: Admission: RE | Admit: 2017-08-28 | Payer: Managed Care, Other (non HMO) | Source: Ambulatory Visit

## 2017-08-28 DIAGNOSIS — I872 Venous insufficiency (chronic) (peripheral): Secondary | ICD-10-CM

## 2017-08-28 NOTE — Progress Notes (Signed)
Chief Complaint: Status post endovenous laser occlusion of the right great saphenous vein to treat venous insufficiency on 07/22/2017 by Dr. Lowella Dandy.  Referring Physician(s): Henn,Adam  History of Present Illness: Diana Mcpherson is a 62 y.o. female status post prior endovenous laser occlusion of the left great saphenous vein by Dr. Lowella Dandy on 06/10/2017.  Additional foam sclerotherapy of small varicosities in the medial left calf as well as endovenous laser occlusion of the right great saphenous vein was performed on 07/22/2017.  Since treatment she has had some mild medial left calf discomfort and mild discomfort in both medial proximal thighs.  Her right lower extremity overall feels better than it did prior to treatment.  She would like to resume some walking and exercise.  Past Medical History:  Diagnosis Date  . Hiatal hernia   . Hypertension   . Hypothyroidism due to Hashimoto's thyroiditis   . Macular degeneration disease   . Osteoarthritis     Past Surgical History:  Procedure Laterality Date  . APPENDECTOMY    . BACK SURGERY    . BREAST EXCISIONAL BIOPSY Left   . CHOLECYSTECTOMY    . IR EMBO VENOUS NOT HEMORR HEMANG  INC GUIDE ROADMAPPING  06/10/2017  . IR EMBO VENOUS NOT HEMORR HEMANG  INC GUIDE ROADMAPPING  07/22/2017  . TONSILLECTOMY    . VAGINAL HYSTERECTOMY      Allergies: Contrast media [iodinated diagnostic agents]; Cymbalta [duloxetine hcl]; Gabapentin; Relafen [nabumetone]; Gadolinium derivatives; and Morphine and related  Medications: Prior to Admission medications   Medication Sig Start Date End Date Taking? Authorizing Provider  atorvastatin (LIPITOR) 10 MG tablet Take 10 mg by mouth 3 (three) times a week. Mon / Wed / Fri    [provider]  calcium carbonate (TUMS - DOSED IN MG ELEMENTAL CALCIUM) 500 MG chewable tablet Chew 1 tablet by mouth daily as needed for indigestion or heartburn.    [provider]  Calcium-Vitamin D (CALTRATE 600  PLUS-VIT D PO) Take 1 tablet by mouth daily.    [provider]  cephALEXin (KEFLEX) 250 MG capsule Take 250 mg by mouth daily as needed (after intercourse).    [provider]  conjugated estrogens (PREMARIN) vaginal cream Place 1 Applicatorful vaginally 3 (three) times a week. Mon / Wed / Fri    [provider]  Cranberry 1000 MG CAPS Take 1,000 mg by mouth 2 (two) times daily.    [provider]  diazepam (VALIUM) 10 MG tablet Take 10 mg by mouth every 6 (six) hours as needed for anxiety.    [provider]  docusate sodium (COLACE) 100 MG capsule Take 100 mg by mouth daily as needed for mild constipation.    [provider]  esomeprazole (NEXIUM) 40 MG capsule Take 40 mg by mouth daily at 12 noon.    [provider]  estradiol (VIVELLE-DOT) 0.025 MG/24HR Place 1 patch onto the skin 2 (two) times a week. Sat / Wed    [provider]  HYDROcodone-acetaminophen (NORCO) 10-325 MG per tablet Take 1 tablet by mouth every 6 (six) hours as needed for moderate pain.     [provider]  levothyroxine (SYNTHROID, LEVOTHROID) 88 MCG tablet Take 88 mcg by mouth daily before breakfast.    [provider]  Misc Natural Products (OSTEO BI-FLEX ADV DOUBLE ST PO) Take 1 tablet by mouth 2 (two) times daily.     [provider]  Multiple Vitamins-Minerals (EYE VITAMINS) CAPS Take 1 capsule  by mouth 2 (two) times daily.    [provider]  oxyCODONE-acetaminophen (PERCOCET/ROXICET) 5-325 MG per tablet Take 2 tablets by mouth every 4 (four) hours as needed for severe pain. Patient not taking: Reported on 04/24/2017 07/04/14   Patel-Mills, Lorelle Formosa, PA-C  saccharomyces boulardii (FLORASTOR) 250 MG capsule Take 250 mg by mouth daily.    [provider]  valsartan (DIOVAN) 80 MG tablet Take 80 mg by mouth daily.    [provider]     Family History  Problem Relation Age of Onset  . Breast cancer  Maternal Aunt   . Breast cancer Paternal Aunt   . Breast cancer Paternal Aunt     Social History   Socioeconomic History  . Marital status: Married    Spouse name: Not on file  . Number of children: Not on file  . Years of education: Not on file  . Highest education level: Not on file  Occupational History  . Not on file  Social Needs  . Financial resource strain: Not on file  . Food insecurity:    Worry: Not on file    Inability: Not on file  . Transportation needs:    Medical: Not on file    Non-medical: Not on file  Tobacco Use  . Smoking status: Former Smoker    Packs/day: 1.00    Types: Cigarettes    Start date: 04/13/1972    Last attempt to quit: 04/14/1979    Years since quitting: 38.4  . Smokeless tobacco: Never Used  Substance and Sexual Activity  . Alcohol use: No    Alcohol/week: 0.0 standard drinks  . Drug use: Not on file  . Sexual activity: Not on file  Lifestyle  . Physical activity:    Days per week: Not on file    Minutes per session: Not on file  . Stress: Not on file  Relationships  . Social connections:    Talks on phone: Not on file    Gets together: Not on file    Attends religious service: Not on file    Active member of club or organization: Not on file    Attends meetings of clubs or organizations: Not on file    Relationship status: Not on file  Other Topics Concern  . Not on file  Social History Narrative  . Not on file    Review of Systems: A 12 point ROS discussed and pertinent positives are indicated in the HPI above.  All other systems are negative.  Review of Systems  Constitutional: Negative.   Respiratory: Negative.   Cardiovascular: Negative.   Gastrointestinal: Negative.   Genitourinary: Negative.   Musculoskeletal: Negative.   Neurological: Negative.   Hematological: Negative.     Vital Signs: There were no vitals taken for this visit.  Physical Exam  Constitutional: She is oriented to person, place, and time. No  distress.  Musculoskeletal:  Right lower extremity: No edema, ecchymosis, hematoma or tenderness.  No palpable varicosities.  Left lower extremity: Some mildly tender firm areas of medial calf corresponding to injected varicosities.  No erythema, ecchymosis or ulceration. No significant edema.  Neurological: She is alert and oriented to person, place, and time.  Skin: Skin is warm and dry. No rash noted. She is not diaphoretic. No erythema.    Imaging: US Venous Img Lower Unilateral Right  Result Date: 08/28/2017 CLINICAL DATA:  Status post endovenous laser occlusion of the right great saphenous vein to treat saphenous venous insufficiency on  07/22/2017. EXAM: RIGHT LOWER EXTREMITY VENOUS DUPLEX ULTRASOUND TECHNIQUE: Gray-scale sonography with graded compression, as well as color Doppler and duplex ultrasound, were performed to evaluate the deep and superficial veins of the lower extremity. Spectral Doppler was utilized to evaluate flow at rest and with distal augmentation maneuvers. I personally performed the technical portion of the exam. COMPARISON:  07/29/2017 FINDINGS: The deep venous system is normally patent and fully compressible without evidence of deep venous thrombosis. Treated segment of the great saphenous vein continues to show durable occlusion after laser ablation and is noncompressible throughout the treated segment. There are some small superficial communicating veins in the calf that remain open. No significant varicosities are currently identified. Single patent mid calf perforator vein again identified. IMPRESSION: Durable occlusion of the right great saphenous vein 1 month post endovenous laser occlusion. No evidence of deep venous thrombosis. No significant communicating varicosities currently identified. Electronically Signed   By: Irish LackGlenn  Latiya Navia M.D.   On: 08/28/2017 11:36   [place] Rad Eval And Mgmt  Result Date: 08/28/2017 Please refer to "Notes" to see consult  details.   Assessment and Plan:  Diana Mcpherson is doing fine 1 month post endovenous occlusion of the right great saphenous vein.  She has healed well and shows no signs of complication.  Ultrasound today demonstrates durable thrombosis and occlusion of the treated right great saphenous vein.  There is no evidence of right lower extremity DVT.  No significant communicating varicosities are identified.  Brief ultrasound performed today of the left calf demonstrates occluded varicosities.  I encouraged Diana Mcpherson to resume normal activities and exercise.  She will wear her compression garments as needed.  I reassured her that some mild left calf tenderness and pain currently is likely still related to some inflammation after recent sclerotherapy of varicosities.  Given that there was some flow in the upper thigh segment of the left GSV after prior laser treatment, the patient would like to follow-up with Dr. Lowella DandyHenn in 6 months.  We will tentatively set up a 173-month follow-up.  She will call the clinic if she is having any worsening symptoms that may require her to be seen earlier.   Electronically SignedIrish Lack: Taim Wurm T 08/28/2017, 11:59 AM     I spent a total of 15 Minutes in face to face in clinical consultation, greater than 50% of which was counseling/coordinating care post endovenous laser occlusion of bilateral great saphenous veins and sclerotherapy of left calf varicosities.

## 2017-10-27 ENCOUNTER — Other Ambulatory Visit (HOSPITAL_COMMUNITY): Payer: Self-pay | Admitting: Diagnostic Radiology

## 2017-10-27 ENCOUNTER — Telehealth: Payer: Self-pay | Admitting: *Deleted

## 2017-10-27 DIAGNOSIS — I8392 Asymptomatic varicose veins of left lower extremity: Secondary | ICD-10-CM

## 2017-10-27 DIAGNOSIS — R609 Edema, unspecified: Secondary | ICD-10-CM

## 2017-10-27 DIAGNOSIS — I8390 Asymptomatic varicose veins of unspecified lower extremity: Secondary | ICD-10-CM

## 2017-10-27 DIAGNOSIS — R52 Pain, unspecified: Secondary | ICD-10-CM

## 2017-10-27 NOTE — Telephone Encounter (Signed)
[name] states she is having some pain and swelling in LLE. I told her we can get her in this week with Dr. Lowella Dandy. She states she is out of town and can wait until he is here in November. I did advise her to go to ED in the state she is in. She states it's not bad and can wait until her appt with Dr. Bernadette Hoit

## 2017-11-25 ENCOUNTER — Ambulatory Visit
Admission: RE | Admit: 2017-11-25 | Discharge: 2017-11-25 | Disposition: A | Discharge status: Home / Self Care | Payer: Managed Care, Other (non HMO) | Source: Ambulatory Visit | Attending: Diagnostic Radiology | Admitting: Diagnostic Radiology

## 2017-11-25 DIAGNOSIS — I8392 Asymptomatic varicose veins of left lower extremity: Secondary | ICD-10-CM

## 2017-11-25 DIAGNOSIS — R609 Edema, unspecified: Secondary | ICD-10-CM

## 2017-11-25 DIAGNOSIS — R52 Pain, unspecified: Secondary | ICD-10-CM

## 2017-11-25 NOTE — Progress Notes (Signed)
Chief Complaint: Patient was seen in consultation today for left lower extremity swelling and pain   Referring Physician(s): Georgianne Fickamachandran, Ajith  History of Present Illness: Diana Mcpherson is a 62 y.o. female with history of symptomatic bilateral lower extremity venous insufficiency.  She has undergone endovascular laser treatment to the bilateral great saphenous veins and ultrasound-guided foam sclerotherapy to varicosities in the left calf.  Patient was on vacation a few weeks ago and noted increased swelling in the left foot and ankle.  In addition, she has intermittent pain in the left medial calf and some pain along the posterior upper left thigh particularly when she is on a recumbent bike.  Overall, her lower extremity symptoms have markedly improved since we started the treatment for the venous insufficiency.  She has no complaints in the right lower extremity at this time.  No recent injuries.  No significant change in her overall health.  Past Medical History:  Diagnosis Date  . Hiatal hernia   . Hypertension   . Hypothyroidism due to Hashimoto's thyroiditis   . Macular degeneration disease   . Osteoarthritis     Past Surgical History:  Procedure Laterality Date  . APPENDECTOMY    . BACK SURGERY    . BREAST EXCISIONAL BIOPSY Left   . CHOLECYSTECTOMY    . IR EMBO VENOUS NOT HEMORR HEMANG  INC GUIDE ROADMAPPING  06/10/2017  . IR EMBO VENOUS NOT HEMORR HEMANG  INC GUIDE ROADMAPPING  07/22/2017  . TONSILLECTOMY    . VAGINAL HYSTERECTOMY      Allergies: Contrast media [iodinated diagnostic agents]; Cymbalta [duloxetine hcl]; Gabapentin; Relafen [nabumetone]; Gadolinium derivatives; and Morphine and related  Medications: Prior to Admission medications   Medication Sig Start Date End Date Taking? Authorizing Provider  atorvastatin (LIPITOR) 10 MG tablet Take 10 mg by mouth 3 (three) times a week. Mon / Wed / Fri    [provider]  calcium carbonate (TUMS - DOSED  IN MG ELEMENTAL CALCIUM) 500 MG chewable tablet Chew 1 tablet by mouth daily as needed for indigestion or heartburn.    [provider]  Calcium-Vitamin D (CALTRATE 600 PLUS-VIT D PO) Take 1 tablet by mouth daily.    [provider]  cephALEXin (KEFLEX) 250 MG capsule Take 250 mg by mouth daily as needed (after intercourse).    [provider]  conjugated estrogens (PREMARIN) vaginal cream Place 1 Applicatorful vaginally 3 (three) times a week. Mon / Wed / Fri    [provider]  Cranberry 1000 MG CAPS Take 1,000 mg by mouth 2 (two) times daily.    [provider]  diazepam (VALIUM) 10 MG tablet Take 10 mg by mouth every 6 (six) hours as needed for anxiety.    [provider]  docusate sodium (COLACE) 100 MG capsule Take 100 mg by mouth daily as needed for mild constipation.    [provider]  esomeprazole (NEXIUM) 40 MG capsule Take 40 mg by mouth daily at 12 noon.    [provider]  estradiol (VIVELLE-DOT) 0.025 MG/24HR Place 1 patch onto the skin 2 (two) times a week. Sat / Wed    [provider]  HYDROcodone-acetaminophen (NORCO) 10-325 MG per tablet Take 1 tablet by mouth every 6 (six) hours as needed for moderate pain.     [provider]  levothyroxine (SYNTHROID, LEVOTHROID) 88 MCG tablet Take 88 mcg by mouth daily before breakfast.    [provider]  Misc Natural Products (OSTEO BI-FLEX  ADV DOUBLE ST PO) Take 1 tablet by mouth 2 (two) times daily.     [provider]  Multiple Vitamins-Minerals (EYE VITAMINS) CAPS Take 1 capsule by mouth 2 (two) times daily.    [provider]  oxyCODONE-acetaminophen (PERCOCET/ROXICET) 5-325 MG per tablet Take 2 tablets by mouth every 4 (four) hours as needed for severe pain. Patient not taking: Reported on 04/24/2017 07/04/14   Patel-Mills, Lorelle Formosa, PA-C  saccharomyces boulardii (FLORASTOR) 250 MG capsule Take 250 mg by mouth daily.     [provider]  valsartan (DIOVAN) 80 MG tablet Take 80 mg by mouth daily.    [provider]     Family History  Problem Relation Age of Onset  . Breast cancer Maternal Aunt   . Breast cancer Paternal Aunt   . Breast cancer Paternal Aunt     Social History   Socioeconomic History  . Marital status: Married    Spouse name: Not on file  . Number of children: Not on file  . Years of education: Not on file  . Highest education level: Not on file  Occupational History  . Not on file  Social Needs  . Financial resource strain: Not on file  . Food insecurity:    Worry: Not on file    Inability: Not on file  . Transportation needs:    Medical: Not on file    Non-medical: Not on file  Tobacco Use  . Smoking status: Former Smoker    Packs/day: 1.00    Types: Cigarettes    Start date: 04/13/1972    Last attempt to quit: 04/14/1979    Years since quitting: 38.6  . Smokeless tobacco: Never Used  Substance and Sexual Activity  . Alcohol use: No    Alcohol/week: 0.0 standard drinks  . Drug use: Not on file  . Sexual activity: Not on file  Lifestyle  . Physical activity:    Days per week: Not on file    Minutes per session: Not on file  . Stress: Not on file  Relationships  . Social connections:    Talks on phone: Not on file    Gets together: Not on file    Attends religious service: Not on file    Active member of club or organization: Not on file    Attends meetings of clubs or organizations: Not on file    Relationship status: Not on file  Other Topics Concern  . Not on file  Social History Narrative  . Not on file    Review of Systems  Cardiovascular: Positive for leg swelling.    Vital Signs: There were no vitals taken for this visit.  Physical Exam  Constitutional: No distress.  Musculoskeletal:  Right lower extremity: No large varicosities.  No significant swelling.  Left lower extremity: Scattered spider veins.  No significant  swelling.  Small palpable varicosities in the medial left calf.       Imaging: US Venous Img Lower Unilateral Left  Result Date: 11/25/2017 CLINICAL DATA:  62 year old with history of bilateral lower extremity venous insufficiency. History of endovascular laser treatment to the bilateral great saphenous veins. History of ultrasound-guided foam sclerotherapy to left calf varicosities. Recently increased swelling in the left lower extremity and intermittent pain behind the left thigh and in the medial left calf. EXAM: LEFT LOWER EXTREMITY VENOUS DOPPLER ULTRASOUND TECHNIQUE: Gray-scale sonography with graded compression, as well as color Doppler and duplex ultrasound were performed to evaluate the lower extremity deep  venous systems from the level of the common femoral vein and including the common femoral, femoral, profunda femoral, popliteal and calf veins including the posterior tibial, peroneal and gastrocnemius veins when visible. The superficial great saphenous vein was also interrogated. Spectral Doppler was utilized to evaluate flow at rest and with distal augmentation maneuvers in the common femoral, femoral and popliteal veins. Left superficial veins were evaluated in an upright position. I personally performed this portion of the procedure. COMPARISON:  None. FINDINGS: Contralateral Common Femoral Vein: Respiratory phasicity is normal and symmetric with the symptomatic side. No evidence of thrombus. Normal compressibility. Common Femoral Vein: No evidence of thrombus. Normal compressibility, respiratory phasicity and response to augmentation. Saphenofemoral Junction: No evidence of thrombus. Normal compressibility and flow on color Doppler imaging. Profunda Femoral Vein: No evidence of thrombus. Normal compressibility and flow on color Doppler imaging. Femoral Vein: No evidence of thrombus. Normal compressibility, respiratory phasicity and response to augmentation. Popliteal Vein: No evidence of  thrombus. Normal compressibility, respiratory phasicity and response to augmentation. Calf Veins: No evidence of thrombus. Normal compressibility and flow on color Doppler imaging. Superficial venous system: The left great saphenous vein is compressible throughout the left thigh. There has been complete recanalization of the left thigh great saphenous vein since the prior examination. The left great saphenous vein in the proximal and mid calf remains thrombosed. Great saphenous vein in the distal left calf is patent. Patent small varicose veins in the medial left calf. No large superficial vein or varicose vein in the posterior thigh. Other Findings:  None. IMPRESSION: Recanalization of the previously treated left great saphenous vein in the thigh. The proximal and mid left calf great saphenous vein remains occluded. Residual patent varicosities in the medial left calf. Negative for deep venous thrombosis in left lower extremity. Electronically Signed   By: Richarda Overlie M.D.   On: 11/25/2017 16:56   [place] [facility]  Result Date: 11/25/2017 Please refer to "Notes" to see consult details.   Labs:  CBC: No results for input(s): WBC, HGB, HCT, PLT in the last 8760 hours.  COAGS: No results for input(s): INR, APTT in the last 8760 hours.  BMP: No results for input(s): NA, K, CL, CO2, GLUCOSE, BUN, CALCIUM, CREATININE, GFRNONAA, GFRAA in the last 8760 hours.  Invalid input(s): CMP  LIVER FUNCTION TESTS: No results for input(s): BILITOT, AST, ALT, ALKPHOS, PROT, ALBUMIN in the last 8760 hours.  TUMOR MARKERS: No results for input(s): AFPTM, CEA, CA199, CHROMGRNA in the last 8760 hours.  Assessment and Plan:  62 year old with history of bilateral lower extremity venous insufficiency.  Left great saphenous vein was treated with endovascular laser treatment on 06/10/2017 and left calf varicosities were treated with ultrasound-guided foam sclerotherapy on 07/22/2017.  Overall, the patient's  symptoms have improved since the treatments but she is now having increased swelling in the left foot and ankle and intermittent left lower extremity pain.  Ultrasound today demonstrated that the previously treated left great saphenous vein has recanalized in the left thigh.  The left great saphenous vein remains occluded in the mid and proximal left calf.  There are varicosities in the left calf associated with the recanalized left great saphenous vein.  Branch vessels coming off the left thigh great saphenous veins are also patent.  I believe that the recanalized left great saphenous vein and residual varicose veins may be causing the patient's swelling and intermittent pain.  Recommend repeat endovascular laser treatment to the left great saphenous vein and  ultrasound-guided foam sclerotherapy to left lower extremity varicosities.  Patient is agreeable to this plan and we will schedule these procedure the upcoming months.  Patient has been instructed to wear the compression stockings for symptomatic relief of the left lower extremity swelling.    Electronically Signed: Arn Medal 11/25/2017, 5:10 PM   I spent a total of    25 Minutes in face to face in clinical consultation, greater than 50% of which was counseling/coordinating care for venous insufficiency.  Patient ID: Diana Mcpherson, female   DOB: [birth year], 62 y.o.   MRN: [id number]

## 2017-12-16 ENCOUNTER — Other Ambulatory Visit (HOSPITAL_COMMUNITY): Payer: Self-pay | Admitting: Diagnostic Radiology

## 2017-12-16 DIAGNOSIS — I872 Venous insufficiency (chronic) (peripheral): Secondary | ICD-10-CM

## 2018-02-04 ENCOUNTER — Other Ambulatory Visit: Payer: Managed Care, Other (non HMO)

## 2018-02-04 ENCOUNTER — Ambulatory Visit: Payer: Managed Care, Other (non HMO)

## 2018-02-04 ENCOUNTER — Other Ambulatory Visit (HOSPITAL_COMMUNITY): Payer: Self-pay | Admitting: Diagnostic Radiology

## 2018-02-04 DIAGNOSIS — I872 Venous insufficiency (chronic) (peripheral): Secondary | ICD-10-CM

## 2018-02-11 ENCOUNTER — Other Ambulatory Visit: Payer: Managed Care, Other (non HMO)

## 2018-02-12 ENCOUNTER — Other Ambulatory Visit: Payer: Self-pay | Admitting: Physician Assistant

## 2018-02-12 DIAGNOSIS — Z1231 Encounter for screening mammogram for malignant neoplasm of breast: Secondary | ICD-10-CM

## 2018-02-24 ENCOUNTER — Other Ambulatory Visit: Payer: Managed Care, Other (non HMO)

## 2018-02-24 ENCOUNTER — Ambulatory Visit
Admission: RE | Admit: 2018-02-24 | Discharge: 2018-02-24 | Disposition: A | Discharge status: Home / Self Care | Payer: Managed Care, Other (non HMO) | Source: Ambulatory Visit | Attending: Diagnostic Radiology | Admitting: Diagnostic Radiology

## 2018-02-24 ENCOUNTER — Encounter: Payer: Self-pay | Admitting: Diagnostic Radiology

## 2018-02-24 ENCOUNTER — Other Ambulatory Visit (HOSPITAL_COMMUNITY): Payer: Self-pay | Admitting: Diagnostic Radiology

## 2018-02-24 DIAGNOSIS — I872 Venous insufficiency (chronic) (peripheral): Secondary | ICD-10-CM

## 2018-02-24 HISTORY — PX: IR EMBO VENOUS NOT HEMORR HEMANG  INC GUIDE ROADMAPPING: IMG5447

## 2018-02-24 NOTE — Progress Notes (Addendum)
Chief Complaint: Patient was seen in consultation today for follow-up of bilateral lower extremity venous insufficiency.   Referring Physician(s): Elbony Mcclimans  History of Present Illness: Diana Mcpherson is a 63 y.o. female with symptomatic bilateral lower extremity venous insufficiency.  Bilateral great saphenous veins have been treated with endovascular laser treatment and she has also had sclerotherapy to left calf varicosities.  Patient presents for follow-up of both lower extremities and anticipate re-treatment of the left great saphenous vein.  With regards to the right lower extremity, she has minimal symptoms.  She notes intermittent pain and discomfort in the right lower extremity but minimal symptoms.  She has prominent spider veins in the right lower extremity that she would like to have treated for cosmetic issues in the future.  With regards to the left lower extremity, she is having increased tenderness and pain in the left calf and left thigh.  Symptoms are most prominent in the medial left calf region.  She denies bleeding or skin changes.  Past Medical History:  Diagnosis Date  . Hiatal hernia   . Hypertension   . Hypothyroidism due to Hashimoto's thyroiditis   . Macular degeneration disease   . Osteoarthritis     Past Surgical History:  Procedure Laterality Date  . APPENDECTOMY    . BACK SURGERY    . BREAST EXCISIONAL BIOPSY Left   . CHOLECYSTECTOMY    . IR EMBO VENOUS NOT HEMORR HEMANG  INC GUIDE ROADMAPPING  06/10/2017  . IR EMBO VENOUS NOT HEMORR HEMANG  INC GUIDE ROADMAPPING  07/22/2017  . TONSILLECTOMY    . VAGINAL HYSTERECTOMY      Allergies: Contrast media [iodinated diagnostic agents]; Cymbalta [duloxetine hcl]; Erythromycin; Gabapentin; Leflunomide; Relafen [nabumetone]; Rosuvastatin; Gadolinium derivatives; Meloxicam; Morphine and related; Nitrofurantoin macrocrystal; and Tapentadol hcl  Medications: Prior to Admission medications   Medication Sig Start  Date End Date Taking? Authorizing Provider  atorvastatin (LIPITOR) 10 MG tablet Take 10 mg by mouth 3 (three) times a week. Mon / Wed / Fri    [provider]  calcium carbonate (TUMS - DOSED IN MG ELEMENTAL CALCIUM) 500 MG chewable tablet Chew 1 tablet by mouth daily as needed for indigestion or heartburn.    [provider]  Calcium-Vitamin D (CALTRATE 600 PLUS-VIT D PO) Take 1 tablet by mouth daily.    [provider]  cephALEXin (KEFLEX) 250 MG capsule Take 250 mg by mouth daily as needed (after intercourse).    [provider]  conjugated estrogens (PREMARIN) vaginal cream Place 1 Applicatorful vaginally 3 (three) times a week. Mon / Wed / Fri    [provider]  Cranberry 1000 MG CAPS Take 1,000 mg by mouth 2 (two) times daily.    [provider]  diazepam (VALIUM) 10 MG tablet Take 10 mg by mouth every 6 (six) hours as needed for anxiety.    [provider]  docusate sodium (COLACE) 100 MG capsule Take 100 mg by mouth daily as needed for mild constipation.    [provider]  esomeprazole (NEXIUM) 40 MG capsule Take 40 mg by mouth daily at 12 noon.    [provider]  estradiol (VIVELLE-DOT) 0.025 MG/24HR Place 1 patch onto the skin 2 (two) times a week. Sat / Wed    [provider]  HYDROcodone-acetaminophen (NORCO) 10-325 MG per tablet Take 1 tablet by mouth every 6 (six) hours as needed for moderate pain.     [provider]  levothyroxine (SYNTHROID, LEVOTHROID)  88 MCG tablet Take 88 mcg by mouth daily before breakfast.    [provider]  Misc Natural Products (OSTEO BI-FLEX ADV DOUBLE ST PO) Take 1 tablet by mouth 2 (two) times daily.     [provider]  Multiple Vitamins-Minerals (EYE VITAMINS) CAPS Take 1 capsule by mouth 2 (two) times daily.    [provider]  oxyCODONE-acetaminophen (PERCOCET/ROXICET) 5-325 MG per tablet Take 2 tablets by mouth every 4 (four)  hours as needed for severe pain. Patient not taking: Reported on 04/24/2017 07/04/14   Patel-Mills, Lorelle Formosa, PA-C  saccharomyces boulardii (FLORASTOR) 250 MG capsule Take 250 mg by mouth daily.    [provider]  valsartan (DIOVAN) 80 MG tablet Take 80 mg by mouth daily.    [provider]     Family History  Problem Relation Age of Onset  . Breast cancer Maternal Aunt   . Breast cancer Paternal Aunt   . Breast cancer Paternal Aunt     Social History   Socioeconomic History  . Marital status: Married    Spouse name: Not on file  . Number of children: Not on file  . Years of education: Not on file  . Highest education level: Not on file  Occupational History  . Not on file  Social Needs  . Financial resource strain: Not on file  . Food insecurity:    Worry: Not on file    Inability: Not on file  . Transportation needs:    Medical: Not on file    Non-medical: Not on file  Tobacco Use  . Smoking status: Former Smoker    Packs/day: 1.00    Types: Cigarettes    Start date: 04/13/1972    Last attempt to quit: 04/14/1979    Years since quitting: 38.8  . Smokeless tobacco: Never Used  Substance and Sexual Activity  . Alcohol use: No    Alcohol/week: 0.0 standard drinks  . Drug use: Not on file  . Sexual activity: Not on file  Lifestyle  . Physical activity:    Days per week: Not on file    Minutes per session: Not on file  . Stress: Not on file  Relationships  . Social connections:    Talks on phone: Not on file    Gets together: Not on file    Attends religious service: Not on file    Active member of club or organization: Not on file    Attends meetings of clubs or organizations: Not on file    Relationship status: Not on file  Other Topics Concern  . Not on file  Social History Narrative  . Not on file     Review of Systems  Skin:       Scattered spider veins in both lower extremities particularly in the right calf.  No skin discoloration or  erythema.  No skin breakdown or bleeding.    Vital Signs: BP (!) 172/79   Temp 98 F (36.7 C) (Oral)   Resp 16   SpO2 100%   Physical Exam Musculoskeletal:     Comments: Right lower extremity: Scattered telangiectasias particularly in the right calf.  No large varicosities.  Left lower extremity: Scattered telangiectasias particularly in the medial left calf.  Patient is tender to palpation in the left calf and left upper thigh region.  No erythema or skin breakdown.  No large varicosities based on physical examination.  Neurological:     Mental Status: She is alert.  Imaging: [place] Venous Img Lower Unilateral Right  Result Date: 02/24/2018 CLINICAL DATA:  63 year old with bilateral lower extremity venous insufficiency. Patient has had treatment bilateral great saphenous veins. Patient presents forfollow-up of the right lower extremity and re-treatment of the left great saphenous vein. Right great saphenous vein was treated with a transcatheter laser occlusion on 07/22/2017. EXAM: RIGHT LOWER EXTREMITY VENOUS DOPPLER ULTRASOUND TECHNIQUE: Gray-scale sonography with graded compression, as well as color Doppler and duplex ultrasound were performed to evaluate the lower extremity deep venous systems from the level of the common femoral vein and including the common femoral, femoral, profunda femoral, popliteal and calf veins including the posterior tibial, peroneal and gastrocnemius veins when visible. The superficial great saphenous vein was also interrogated. Spectral Doppler was utilized to evaluate flow at rest and with distal augmentation maneuvers in the common femoral, femoral and popliteal veins. COMPARISON:  None. FINDINGS: Contralateral Common Femoral Vein: Respiratory phasicity is normal and symmetric with the symptomatic side. No evidence of thrombus. Normal compressibility. Common Femoral Vein: No evidence of thrombus. Normal compressibility, respiratory phasicity and response  to augmentation. Saphenofemoral Junction: No evidence of thrombus. Normal compressibility and flow on color Doppler imaging. Profunda Femoral Vein: No evidence of thrombus. Normal compressibility and flow on color Doppler imaging. Femoral Vein: No evidence of thrombus. Normal compressibility, respiratory phasicity and response to augmentation. Popliteal Vein: No evidence of thrombus. Normal compressibility, respiratory phasicity and response to augmentation. Superficial Great Saphenous Vein: The treated right great saphenous vein is occluded in the proximal thigh, mid thigh, distal thigh, knee, proximal calf and mid calf. There is a patent venous branch along the anterior right thigh but this is separate from the great saphenous vein. Other Findings:  No large varicosities. IMPRESSION: 1. Treated right great saphenous vein is occluded. No large varicosities in right lower extremity. 2.  Negative for deep venous thrombosis in right lower extremity. Electronically Signed   By: Richarda OverlieAdam  Sitara Cashwell M.D.   On: 02/24/2018 11:10    Left lower extremity ultrasound: Patent varicosities in the left calf.  The left great saphenous vein has recanalized from the saphenofemoral junction to the proximal calf.  Prominent varicosity in the left medial calf is associated with the recanalized GSV.   Labs:  CBC:  No results for input(s): WBC, HGB, HCT, PLT in the last 8760 hours.  COAGS: No results for input(s): INR, APTT in the last 8760 hours.  BMP: No results for input(s): NA, K, CL, CO2, GLUCOSE, BUN, CALCIUM, CREATININE, GFRNONAA, GFRAA in the last 8760 hours.  Invalid input(s): CMP  LIVER FUNCTION TESTS: No results for input(s): BILITOT, AST, ALT, ALKPHOS, PROT, ALBUMIN in the last 8760 hours.  TUMOR MARKERS: No results for input(s): AFPTM, CEA, CA199, CHROMGRNA in the last 8760 hours.  Assessment and Plan:  #1 Right lower extremity superficial insufficiency: Right great saphenous vein was treated with  endovascular laser occlusion on 07/22/2017.  Treated right saphenous vein is occluded.  No large varicosities in the right lower extremity.  Negative for DVT in the right lower extremity.  Patient has minimal intermittent symptoms in the right lower extremity.  No plans for additional therapy to the right lower extremity at this time.  Patient is interested in cosmetic sclerotherapy in the future and we can make a referral to dermatology when she is ready.  #2  Left lower extremity venous insufficiency: Patient remains symptomatic in the left lower extremity with worsening pain in the left thigh and calf.  Today's ultrasound confirms that  there is increased recanalization of the left great saphenous vein.  The left great saphenous vein is now patent from the saphenofemoral junction to the proximal calf.  There is a prominent varicosity associated with the patent proximal calf GSV.    Patient underwent endovascular laser treatment to the recanalized left great saphenous vein from the proximal calf to the saphenofemoral junction.  In addition, the prominent varicose vein in the medial left calf was treated with ultrasound-guided foam sclerotherapy.  Patient tolerated the procedure well.  Plan for follow-up evaluation of the left leg in 1 week.   Electronically Signed: Arn Medal 02/24/2018, 11:18 AM   I spent a total of  15 Minutes in face to face in clinical consultation, greater than 50% of which was counseling/coordinating care for bilateral lower extremity venous insufficiency.  Patient ID: Katessa Vatalaro, female   DOB: Jan 22, 1955, 63 y.o.   MRN: [id number]

## 2018-03-05 ENCOUNTER — Ambulatory Visit
Admission: RE | Admit: 2018-03-05 | Discharge: 2018-03-05 | Disposition: A | Discharge status: Home / Self Care | Payer: Managed Care, Other (non HMO) | Source: Ambulatory Visit | Attending: Diagnostic Radiology | Admitting: Diagnostic Radiology

## 2018-03-05 DIAGNOSIS — I872 Venous insufficiency (chronic) (peripheral): Secondary | ICD-10-CM

## 2018-03-05 NOTE — Progress Notes (Signed)
Patient ID: Diana Mcpherson, female   DOB: 03/20/1955, 63 y.o.   MRN: [id number]       Chief Complaint:   1 week status post left GSV transcatheter laser occlusion  Referring Physician(s): Henn,Adam  History of Present Illness: Diana Mcpherson is a 63 y.o. female with history of venous insufficiency and varicose veins.  She is now 1 week status post repeat left GSV transcatheter laser occlusion.  She returns for outpatient follow-up.  She has recovered over the last week very well.  No interval leg pain, fevers, signs of cellulitis or thrombophlebitis.  Minor bruising along the treated segment.  She has been compliant with compression stockings and walking.  Overall she reports improvement in her left lower extremity.    Past Medical History:  Diagnosis Date  . Hiatal hernia   . Hypertension   . Hypothyroidism due to Hashimoto's thyroiditis   . Macular degeneration disease   . Osteoarthritis     Past Surgical History:  Procedure Laterality Date  . APPENDECTOMY    . BACK SURGERY    . BREAST EXCISIONAL BIOPSY Left   . CHOLECYSTECTOMY    . IR EMBO VENOUS NOT HEMORR HEMANG  INC GUIDE ROADMAPPING  06/10/2017  . IR EMBO VENOUS NOT HEMORR HEMANG  INC GUIDE ROADMAPPING  07/22/2017  . IR EMBO VENOUS NOT HEMORR HEMANG  INC GUIDE ROADMAPPING  02/24/2018  . TONSILLECTOMY    . VAGINAL HYSTERECTOMY      Allergies: Contrast media [iodinated diagnostic agents]; Cymbalta [duloxetine hcl]; Erythromycin; Gabapentin; Leflunomide; Relafen [nabumetone]; Rosuvastatin; Gadolinium derivatives; Meloxicam; Morphine and related; Nitrofurantoin macrocrystal; and Tapentadol hcl  Medications: Prior to Admission medications   Medication Sig Start Date End Date Taking? Authorizing Provider  atorvastatin (LIPITOR) 10 MG tablet Take 10 mg by mouth 3 (three) times a week. Mon / Wed / Fri    [provider]  calcium carbonate (TUMS - DOSED IN MG ELEMENTAL CALCIUM) 500 MG chewable tablet Chew 1 tablet by mouth  daily as needed for indigestion or heartburn.    [provider]  Calcium-Vitamin D (CALTRATE 600 PLUS-VIT D PO) Take 1 tablet by mouth daily.    [provider]  cephALEXin (KEFLEX) 250 MG capsule Take 250 mg by mouth daily as needed (after intercourse).    [provider]  conjugated estrogens (PREMARIN) vaginal cream Place 1 Applicatorful vaginally 3 (three) times a week. Mon / Wed / Fri    [provider]  Cranberry 1000 MG CAPS Take 1,000 mg by mouth 2 (two) times daily.    [provider]  diazepam (VALIUM) 10 MG tablet Take 10 mg by mouth every 6 (six) hours as needed for anxiety.    [provider]  docusate sodium (COLACE) 100 MG capsule Take 100 mg by mouth daily as needed for mild constipation.    [provider]  esomeprazole (NEXIUM) 40 MG capsule Take 40 mg by mouth daily at 12 noon.    [provider]  estradiol (VIVELLE-DOT) 0.025 MG/24HR Place 1 patch onto the skin 2 (two) times a week. Sat / Wed    [provider]  HYDROcodone-acetaminophen (NORCO) 10-325 MG per tablet Take 1 tablet by mouth every 6 (six) hours as needed for moderate pain.     [provider]  levothyroxine (SYNTHROID, LEVOTHROID) 88 MCG tablet Take 88 mcg by mouth daily before breakfast.    [provider]  Misc Natural Products (OSTEO BI-FLEX ADV DOUBLE ST PO) Take 1 tablet by  mouth 2 (two) times daily.     [provider]  Multiple Vitamins-Minerals (EYE VITAMINS) CAPS Take 1 capsule by mouth 2 (two) times daily.    [provider]  oxyCODONE-acetaminophen (PERCOCET/ROXICET) 5-325 MG per tablet Take 2 tablets by mouth every 4 (four) hours as needed for severe pain. Patient not taking: Reported on 04/24/2017 07/04/14   Patel-Mills, Lorelle Formosa, PA-C  saccharomyces boulardii (FLORASTOR) 250 MG capsule Take 250 mg by mouth daily.    [provider]  valsartan (DIOVAN) 80 MG tablet Take 80 mg by mouth  daily.    [provider]     Family History  Problem Relation Age of Onset  . Breast cancer Maternal Aunt   . Breast cancer Paternal Aunt   . Breast cancer Paternal Aunt     Social History   Socioeconomic History  . Marital status: Married    Spouse name: Not on file  . Number of children: Not on file  . Years of education: Not on file  . Highest education level: Not on file  Occupational History  . Not on file  Social Needs  . Financial resource strain: Not on file  . Food insecurity:    Worry: Not on file    Inability: Not on file  . Transportation needs:    Medical: Not on file    Non-medical: Not on file  Tobacco Use  . Smoking status: Former Smoker    Packs/day: 1.00    Types: Cigarettes    Start date: 04/13/1972    Last attempt to quit: 04/14/1979    Years since quitting: 38.9  . Smokeless tobacco: Never Used  Substance and Sexual Activity  . Alcohol use: No    Alcohol/week: 0.0 standard drinks  . Drug use: Not on file  . Sexual activity: Not on file  Lifestyle  . Physical activity:    Days per week: Not on file    Minutes per session: Not on file  . Stress: Not on file  Relationships  . Social connections:    Talks on phone: Not on file    Gets together: Not on file    Attends religious service: Not on file    Active member of club or organization: Not on file    Attends meetings of clubs or organizations: Not on file    Relationship status: Not on file  Other Topics Concern  . Not on file  Social History Narrative  . Not on file     Review of Systems: A 12 point ROS discussed and pertinent positives are indicated in the HPI above.  All other systems are negative.  Review of Systems  Vital Signs: There were no vitals taken for this visit.  Physical Exam Constitutional:      General: She is not in acute distress.    Appearance: She is not ill-appearing or toxic-appearing.  Musculoskeletal: Normal range of motion.        General: No  swelling or tenderness.     Left lower leg: No edema.     Comments: Bruising noted along the left GSV treated segment in the medial thigh and calf.  Skin entry site well-healed.      Imaging: US Venous Img Lower Unilateral Left  Result Date: 03/05/2018 CLINICAL DATA:  One week status post left GSV laser occlusion EXAM: LEFT LOWER EXTREMITY VENOUS DOPPLER ULTRASOUND TECHNIQUE: Gray-scale sonography with graded compression, as well as color Doppler and duplex ultrasound were performed to evaluate the  lower extremity deep venous systems from the level of the common femoral vein and including the common femoral, femoral, profunda femoral, popliteal and calf veins including the posterior tibial, peroneal and gastrocnemius veins when visible. The superficial great saphenous vein was also interrogated. Spectral Doppler was utilized to evaluate flow at rest and with distal augmentation maneuvers in the common femoral, femoral and popliteal veins. COMPARISON:  None. FINDINGS: Contralateral Common Femoral Vein: Respiratory phasicity is normal and symmetric with the symptomatic side. No evidence of thrombus. Normal compressibility. Common Femoral Vein: No evidence of thrombus. Normal compressibility, respiratory phasicity and response to augmentation. Saphenofemoral Junction: No evidence of thrombus. Normal compressibility and flow on color Doppler imaging. Profunda Femoral Vein: No evidence of thrombus. Normal compressibility and flow on color Doppler imaging. Femoral Vein: No evidence of thrombus. Normal compressibility, respiratory phasicity and response to augmentation. Popliteal Vein: No evidence of thrombus. Normal compressibility, respiratory phasicity and response to augmentation. Calf Veins: No evidence of thrombus. Normal compressibility and flow on color Doppler imaging. Superficial Great Saphenous Vein: Left GSV treated segment is occluded just inferior to the saphenofemoral junction extending to the mid  calf level. Calf varicosities are also thrombosed. Other Findings:  None. IMPRESSION: Left GSV treated segment is occluded just inferior to the patent saphenofemoral junction to the mid calf level. Calf varicosities are also thrombosed. Negative for DVT. Electronically Signed   By: Judie Petit.  Grant[place] Want M.D.   On: 03/05/2018 11:57   US Venous Img Lower Unilateral Right  Result Date: 02/24/2018 CLINICAL DATA:  63 year old with bilateral lower extremity venous insufficiency. Patient has had treatment bilateral great saphenous veins. Patient presents forfollow-up of the right lower extremity and re-treatment of the left great saphenous vein. Right great saphenous vein was treated with a transcatheter laser occlusion on 07/22/2017. EXAM: RIGHT LOWER EXTREMITY VENOUS DOPPLER ULTRASOUND TECHNIQUE: Gray-scale sonography with graded compression, as well as color Doppler and duplex ultrasound were performed to evaluate the lower extremity deep venous systems from the level of the common femoral vein and including the common femoral, femoral, profunda femoral, popliteal and calf veins including the posterior tibial, peroneal and gastrocnemius veins when visible. The superficial great saphenous vein was also interrogated. Spectral Doppler was utilized to evaluate flow at rest and with distal augmentation maneuvers in the common femoral, femoral and popliteal veins. COMPARISON:  None. FINDINGS: Contralateral Common Femoral Vein: Respiratory phasicity is normal and symmetric with the symptomatic side. No evidence of thrombus. Normal compressibility. Common Femoral Vein: No evidence of thrombus. Normal compressibility, respiratory phasicity and response to augmentation. Saphenofemoral Junction: No evidence of thrombus. Normal compressibility and flow on color Doppler imaging. Profunda Femoral Vein: No evidence of thrombus. Normal compressibility and flow on color Doppler imaging. Femoral Vein: No evidence of thrombus. Normal  compressibility, respiratory phasicity and response to augmentation. Popliteal Vein: No evidence of thrombus. Normal compressibility, respiratory phasicity and response to augmentation. Superficial Great Saphenous Vein: The treated right great saphenous vein is occluded in the proximal thigh, mid thigh, distal thigh, knee, proximal calf and mid calf. There is a patent venous branch along the anterior right thigh but this is separate from the great saphenous vein. Other Findings:  No large varicosities. IMPRESSION: 1. Treated right great saphenous vein is occluded. No large varicosities in right lower extremity. 2.  Negative for deep venous thrombosis in right lower extremity. Electronically Signed   By: Richarda Overlie M.D.   On: 02/24/2018 11:10   [place] Injec Sclerotherapy Mult  Result Date: 02/24/2018 CLINICAL DATA:  63 year old with symptomatic left lower extremity venous insufficiency. Previously treated left great saphenous vein has recanalized. There are symptomatic varicose veins in the left calf. Plan for endovascular laser treatment to the left great saphenous vein and ultrasound-guided foam sclerotherapy to left calf varicosities EXAM: ENDOVASCULAR LASER TREATMENT TO THE LEFT GREAT SAPHENOUS VEIN. ULTRASOUND-GUIDED FOAM SCLEROTHERAPY TO LEFT CALF VARICOSITIES Physician: Rachelle Hora. Lowella Dandy, MD MEDICATIONS: MEDICATIONS None. ANESTHESIA/SEDATION: Volume 15 mg p.o. COMPLICATIONS: None immediate. PROCEDURE: The procedure was explained to the patient. The risks and benefits of the procedure were discussed and that patient's questions were addressed. Informed consent was obtained from the patient. The patient was placed in a supine position. The left lower extremity superficial venous system was evaluated with ultrasound. The left great saphenous vein was identified and the vein path was marked on the skin. The left leg and left groin were prepped with chlorhexidine and a sterile drape was placed. The skin below the left  knee was anesthetized with 1% lidocaine. Patent varicose veins in the medial left calf for targeted for foam sclerotherapy. 1% polydochol was combined with room air to create a foam. 25 gauge needle was directed into a varicosity with ultrasound guidance. The foam sclerosant was injected under ultrasound guidance. Foam sclerosant was identified within the varicosities and actually extending into the recanalized left great saphenous vein. Attention was directed to the left great saphenous vein. A 21 gauge needle was directed into the great saphenous vein below the knee with ultrasound guidance and a micropuncture dilator set was placed. Wire was advanced into the proximal left great saphenous vein. A 0.035 wire was advanced to the saphenofemoral junction with ultrasound guidance. A 65 cm cm sheath was placed over the wire and positioned distal to the saphenofemoral junction. Dilute lidocaine was used as a tumescent. The great saphenous vein from the percutaneous entry site to the saphenofemoral junction was treated with 450 ml of the tumescent. The laser treatment was performed with 1498.6 joules over 250 seconds. The sheath and laser were removed as a single entity. Manual compression was placed over the percutaneous entry site. Dressings were placed over the puncture sites and the leg was placed in a compression stocking. The great saphenous vein was accessed below the knee and the laser was positioned great than 2 cm from the saphenofemoral junction. IMPRESSION: Successful transcatheter laser occlusion of the left great saphenous vein with ultrasound guidance. Successful ultrasound-guided foam sclerotherapy to varicosities in the left calf. Electronically Signed   By: Richarda Overlie M.D.   On: 02/24/2018 12:49   [place] Rad Eval And Mgmt  Result Date: 03/05/2018 Please refer to "Notes" to see consult details.  [place] Rad Eval And Mgmt  Result Date: 02/24/2018 Please refer to "Notes" to see consult details.  Ir Embo  Venous Not Hemorr Hemang  Inc Guide Roadmapping  Result Date: 02/24/2018 CLINICAL DATA:  63 year old with symptomatic left lower extremity venous insufficiency. Previously treated left great saphenous vein has recanalized. There are symptomatic varicose veins in the left calf. Plan for endovascular laser treatment to the left great saphenous vein and ultrasound-guided foam sclerotherapy to left calf varicosities EXAM: ENDOVASCULAR LASER TREATMENT TO THE LEFT GREAT SAPHENOUS VEIN. ULTRASOUND-GUIDED FOAM SCLEROTHERAPY TO LEFT CALF VARICOSITIES Physician: Rachelle Hora. Lowella Dandy, MD MEDICATIONS: MEDICATIONS None. ANESTHESIA/SEDATION: Volume 15 mg p.o. COMPLICATIONS: None immediate. PROCEDURE: The procedure was explained to the patient. The risks and benefits of the procedure were discussed and that patient's questions were addressed. Informed consent was obtained from the patient. The  patient was placed in a supine position. The left lower extremity superficial venous system was evaluated with ultrasound. The left great saphenous vein was identified and the vein path was marked on the skin. The left leg and left groin were prepped with chlorhexidine and a sterile drape was placed. The skin below the left knee was anesthetized with 1% lidocaine. Patent varicose veins in the medial left calf for targeted for foam sclerotherapy. 1% polydochol was combined with room air to create a foam. 25 gauge needle was directed into a varicosity with ultrasound guidance. The foam sclerosant was injected under ultrasound guidance. Foam sclerosant was identified within the varicosities and actually extending into the recanalized left great saphenous vein. Attention was directed to the left great saphenous vein. A 21 gauge needle was directed into the great saphenous vein below the knee with ultrasound guidance and a micropuncture dilator set was placed. Wire was advanced into the proximal left great saphenous vein. A 0.035 wire was advanced to  the saphenofemoral junction with ultrasound guidance. A 65 cm cm sheath was placed over the wire and positioned distal to the saphenofemoral junction. Dilute lidocaine was used as a tumescent. The great saphenous vein from the percutaneous entry site to the saphenofemoral junction was treated with 450 ml of the tumescent. The laser treatment was performed with 1498.6 joules over 250 seconds. The sheath and laser were removed as a single entity. Manual compression was placed over the percutaneous entry site. Dressings were placed over the puncture sites and the leg was placed in a compression stocking. The great saphenous vein was accessed below the knee and the laser was positioned great than 2 cm from the saphenofemoral junction. IMPRESSION: Successful transcatheter laser occlusion of the left great saphenous vein with ultrasound guidance. Successful ultrasound-guided foam sclerotherapy to varicosities in the left calf. Electronically Signed   By: Richarda Overlie M.D.   On: 02/24/2018 12:49    Labs:  CBC: No results for input(s): WBC, HGB, HCT, PLT in the last 8760 hours.  COAGS: No results for input(s): INR, APTT in the last 8760 hours.  BMP: No results for input(s): NA, K, CL, CO2, GLUCOSE, BUN, CALCIUM, CREATININE, GFRNONAA, GFRAA in the last 8760 hours.  Invalid input(s): CMP  LIVER FUNCTION TESTS: No results for input(s): BILITOT, AST, ALT, ALKPHOS, PROT, ALBUMIN in the last 8760 hours.    Assessment and Plan:  Doing well 1 week status post left GSV transcatheter laser occlusion and calf varicosity sclerotherapy.  Ultrasound confirms occlusion of the left GSV treated segment and the varicosities.  Overall she is recovering well without any delayed complication.  Plan: Continue walking and daily compression stocking.  Advance physical activity in 1 week.  Follow-up in 1 month as an outpatient.   Electronically Signed: Berdine Dance 03/05/2018, 1:40 PM   I spent a total of    15 Minutes  in face to face in clinical consultation, greater than 50% of which was counseling/coordinating care for this patient with left lower extremity venous insufficiency and painful varicose veins

## 2018-03-23 ENCOUNTER — Other Ambulatory Visit: Payer: Self-pay

## 2018-03-23 ENCOUNTER — Ambulatory Visit
Admission: RE | Admit: 2018-03-23 | Discharge: 2018-03-23 | Disposition: A | Discharge status: Home / Self Care | Payer: Managed Care, Other (non HMO) | Source: Ambulatory Visit | Attending: Physician Assistant | Admitting: Physician Assistant

## 2018-03-23 DIAGNOSIS — Z1231 Encounter for screening mammogram for malignant neoplasm of breast: Secondary | ICD-10-CM

## 2018-03-31 ENCOUNTER — Other Ambulatory Visit: Payer: Managed Care, Other (non HMO)

## 2018-04-01 ENCOUNTER — Other Ambulatory Visit: Payer: Managed Care, Other (non HMO)

## 2018-08-05 ENCOUNTER — Ambulatory Visit
Admission: RE | Admit: 2018-08-05 | Discharge: 2018-08-05 | Disposition: A | Discharge status: Home / Self Care | Payer: Managed Care, Other (non HMO) | Source: Ambulatory Visit | Attending: Diagnostic Radiology | Admitting: Diagnostic Radiology

## 2018-08-05 DIAGNOSIS — I872 Venous insufficiency (chronic) (peripheral): Secondary | ICD-10-CM

## 2018-08-05 NOTE — Progress Notes (Signed)
Chief Complaint: Patient was seen in consultation today for lower extremity venous insufficiency.  Referring Physician(s): Robby Pirani  History of Present Illness: Diana Mcpherson is a 63 y.o. female with history of symptomatic lower extremity venous insufficiency.  She has undergone endovascular laser treatment of bilateral great saphenous veins and ultrasound-guided foam sclerotherapy to left calf varicosities.  Patient had recanalization of the left great saphenous vein and underwent a second laser treatment to the left great saphenous vein on 02/24/2018.  1 week follow-up after the endovascular laser treatment was unremarkable.  Patient was unable to have her 1 month follow-up due to the [place] pandemic.  Overall, she has decreased symptoms in the left lower extremity since the retreatment of the left great saphenous vein.  She still has occasional pain and cramping in the medial left calf and occasional pain in the medial left thigh.  She wore the compression stockings regularly for approximately 3 months after the procedure and has just recently stopped wearing them due to the warm temperatures.  She gets some pain relief when she puts on the compression stockings.  No significant symptoms in the right lower extremity.  She is interested in having cosmetic sclerotherapy done for spider veins in the right leg.  Past Medical History:  Diagnosis Date   Hiatal hernia    Hypertension    Hypothyroidism due to Hashimoto's thyroiditis    Macular degeneration disease    Osteoarthritis     Past Surgical History:  Procedure Laterality Date   APPENDECTOMY     BACK SURGERY     BREAST EXCISIONAL BIOPSY Left    CHOLECYSTECTOMY     IR EMBO VENOUS NOT HEMORR HEMANG  INC GUIDE ROADMAPPING  06/10/2017   IR EMBO VENOUS NOT HEMORR HEMANG  INC GUIDE ROADMAPPING  07/22/2017   IR EMBO VENOUS NOT HEMORR HEMANG  INC GUIDE ROADMAPPING  02/24/2018   TONSILLECTOMY     VAGINAL HYSTERECTOMY       Allergies: Contrast media [iodinated diagnostic agents], Cymbalta [duloxetine hcl], Erythromycin, Gabapentin, Leflunomide, Relafen [nabumetone], Rosuvastatin, Gadolinium derivatives, Meloxicam, Morphine and related, Nitrofurantoin macrocrystal, and Tapentadol hcl  Medications: Prior to Admission medications   Medication Sig Start Date End Date Taking? Authorizing Provider  atorvastatin (LIPITOR) 10 MG tablet Take 10 mg by mouth 3 (three) times a week. Mon / Wed / Fri    [provider]  calcium carbonate (TUMS - DOSED IN MG ELEMENTAL CALCIUM) 500 MG chewable tablet Chew 1 tablet by mouth daily as needed for indigestion or heartburn.    [provider]  Calcium-Vitamin D (CALTRATE 600 PLUS-VIT D PO) Take 1 tablet by mouth daily.    [provider]  cephALEXin (KEFLEX) 250 MG capsule Take 250 mg by mouth daily as needed (after intercourse).    [provider]  conjugated estrogens (PREMARIN) vaginal cream Place 1 Applicatorful vaginally 3 (three) times a week. Mon / Wed / Fri    [provider]  Cranberry 1000 MG CAPS Take 1,000 mg by mouth 2 (two) times daily.    [provider]  diazepam (VALIUM) 10 MG tablet Take 10 mg by mouth every 6 (six) hours as needed for anxiety.    [provider]  docusate sodium (COLACE) 100 MG capsule Take 100 mg by mouth daily as needed for mild constipation.    [provider]  esomeprazole (NEXIUM) 40 MG capsule Take 40 mg by mouth daily at 12 noon.    [provider]  estradiol (  VIVELLE-DOT) 0.025 MG/24HR Place 1 patch onto the skin 2 (two) times a week. Sat / Wed    [provider]  HYDROcodone-acetaminophen (NORCO) 10-325 MG per tablet Take 1 tablet by mouth every 6 (six) hours as needed for moderate pain.     [provider]  levothyroxine (SYNTHROID, LEVOTHROID) 88 MCG tablet Take 88 mcg by mouth daily before breakfast.    [provider]  Misc  Natural Products (OSTEO BI-FLEX ADV DOUBLE ST PO) Take 1 tablet by mouth 2 (two) times daily.     [provider]  Multiple Vitamins-Minerals (EYE VITAMINS) CAPS Take 1 capsule by mouth 2 (two) times daily.    [provider]  oxyCODONE-acetaminophen (PERCOCET/ROXICET) 5-325 MG per tablet Take 2 tablets by mouth every 4 (four) hours as needed for severe pain. Patient not taking: Reported on 04/24/2017 07/04/14   Patel-Mills, Lorelle FormosaHanna, PA-C  saccharomyces boulardii (FLORASTOR) 250 MG capsule Take 250 mg by mouth daily.    [provider]  valsartan (DIOVAN) 80 MG tablet Take 80 mg by mouth daily.    [provider]     Family History  Problem Relation Age of Onset   Breast cancer Maternal Aunt    Breast cancer Paternal Aunt    Breast cancer Paternal Aunt     Social History   Socioeconomic History   Marital status: Married    Spouse name: Not on file   Number of children: Not on file   Years of education: Not on file   Highest education level: Not on file  Occupational History   Not on file  Social Needs   Financial resource strain: Not on file   Food insecurity    Worry: Not on file    Inability: Not on file   Transportation needs    Medical: Not on file    Non-medical: Not on file  Tobacco Use   Smoking status: Former Smoker    Packs/day: 1.00    Types: Cigarettes    Start date: 04/13/1972    Quit date: 04/14/1979    Years since quitting: 39.3   Smokeless tobacco: Never Used  Substance and Sexual Activity   Alcohol use: No    Alcohol/week: 0.0 standard drinks   Drug use: Not on file   Sexual activity: Not on file  Lifestyle   Physical activity    Days per week: Not on file    Minutes per session: Not on file   Stress: Not on file  Relationships   Social connections    Talks on phone: Not on file    Gets together: Not on file    Attends religious service: Not on file    Active member of club or organization: Not on  file    Attends meetings of clubs or organizations: Not on file    Relationship status: Not on file  Other Topics Concern   Not on file  Social History Narrative   Not on file      Review of Systems  Musculoskeletal:       Left calf pain.    Vital Signs: There were no vitals taken for this visit.  Physical Exam Constitutional:      Appearance: Normal appearance.  Musculoskeletal:     Comments: Right lower extremity: No significant swelling.  Scattered telangiectasias in the right lower extremity.  Left lower extremity: No significant swelling.  Small varicosities along the medial left calf.  Linear area of discoloration in the medial  left upper calf related to thrombosed and previously treated varicose vein.  Scattered spider veins in left lower extremity.  Neurological:     Mental Status: She is alert.          Imaging: [place] Venous Img Lower Unilateral Left  Result Date: 08/05/2018 CLINICAL DATA:  63 year old with history symptomatic superficial venous insufficiency. Patient has undergone endovascular laser treatment to bilateral great saphenous veins and re-treatment of the left great saphenous vein. Previous sclerotherapy to varicose veins in left lower extremity. Patient presents for follow-up since the treatment on 02/24/2018. EXAM: LEFT LOWER EXTREMITY VENOUS DOPPLER ULTRASOUND TECHNIQUE: Gray-scale sonography with graded compression, as well as color Doppler and duplex ultrasound were performed to evaluate the lower extremity deep venous systems from the level of the common femoral vein and including the common femoral, femoral, profunda femoral, popliteal and calf veins including the posterior tibial, peroneal and gastrocnemius veins when visible. The superficial great saphenous vein was also interrogated. Spectral Doppler was utilized to evaluate flow at rest and with distal augmentation maneuvers in the common femoral, femoral and popliteal veins. Evaluation of  superficial venous system was performed in upright position. COMPARISON:  None. FINDINGS: Common Femoral Vein: No evidence of thrombus. Normal compressibility, respiratory phasicity and response to augmentation. Saphenofemoral Junction: No evidence of thrombus. Normal compressibility and flow on color Doppler imaging. Profunda Femoral Vein: No evidence of thrombus. Normal compressibility and flow on color Doppler imaging. Femoral Vein: No evidence of thrombus. Normal compressibility, respiratory phasicity and response to augmentation. Popliteal Vein: No evidence of thrombus. Normal compressibility, respiratory phasicity and response to augmentation. Calf Veins: No evidence of thrombus.  Normal compressibility. Superficial Great Saphenous Vein: Saphenofemoral junction is patent. Proximal thigh great saphenous vein is compressible and patent. Great saphenous vein in the mid and distal thigh is small and thrombosed. Great saphenous vein at the knee, proximal calf and mid calf is small and thrombosed. Distal calf GSV is patent. No evidence of reflux within the patent proximal thigh great saphenous vein. There is a prominent medial branch of the proximal thigh great saphenous vein but no significant reflux in this branch. There is another anterior branch that comes off the proximal thigh great saphenous but no reflux within this vein. Short saphenous Vein: No reflux in the left short saphenous vein. Varicosities: Small varicosities in the medial left calf associated with perforator branches. Prominent thrombosed varicosity in the proximal medial calf. IMPRESSION: 1. Negative for deep venous thrombosis in left lower extremity. 2. Majority of the treated left great saphenous vein remains occluded. The proximal thigh left great saphenous vein is patent and there are 2 small branches coming off the proximal thigh great saphenous vein but no reflux in these branches or the proximal thigh GSV. 3. 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 Electronically Signed   By: Richarda OverlieAdam  Maedell Hedger M.D.   On: 08/05/2018 14:48   [facility]  Result Date: 08/05/2018 Please refer to "Notes" to see consult details.   Labs:  CBC: No results for input(s): WBC, HGB, HCT, PLT in the last 8760 hours.  COAGS: No results for input(s): INR, APTT in the last 8760 hours.  BMP: No results for input(s): NA, K, CL, CO2, GLUCOSE, BUN, CALCIUM, CREATININE, GFRNONAA, GFRAA in the last 8760 hours.  Invalid input(s): CMP  LIVER FUNCTION TESTS: No results for input(s): BILITOT, AST, ALT, ALKPHOS, PROT, ALBUMIN in the last 8760 hours.  TUMOR MARKERS: No results for input(s): AFPTM, CEA,  CA199, CHROMGRNA in the last 8760 hours.  Assessment and Plan:  63 year old with history of bilateral lower extremity superficial venous insufficiency.  Bilateral great saphenous veins have been treated with endovascular laser occlusion and prior ultrasound-guided foam sclerotherapy to left calf varicosities.  Left great saphenous vein was retreated with endovascular laser therapy on 02/24/2018 due to recanalization.  Decreased symptoms in the left lower extremity since the most recent treatment.  She still has occasional discomfort and pain in the medial left calf.  Ultrasound examination today demonstrates that majority of the left great saphenous vein remains occluded although the proximal thigh GSV is patent with at least 2 prominent branches.  Fortunately, there is no evidence of reflux within the proximal great saphenous vein or these branching veins.  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  There is a prominent thrombosed varicose vein in the proximal medial left calf.  At this time, the patient has minimal symptoms from her lower extremity superficial venous insufficiency.  She has occasional pain and symptoms involving the small varicosities in the medial left calf related to  perforator disease.  These branches would be amenable to ultrasound-guided foam sclerotherapy but the patient would like to wait and see if the symptoms progress or change.  I am a little bit concerned that the proximal left great saphenous vein remains patent after 2 endovascular laser treatments.  There are 2 prominent GSV branch vessels in the proximal thigh that are patent but there is no evidence for reflux within these branch vessels or the proximal great saphenous vein.  Explained to the patient that she may be at risk for recanalization of the left great saphenous vein again but only time will tell.  Patient will follow-up as needed.  Thank you for this interesting consult.  I greatly enjoyed meeting Diana Mcpherson and look forward to participating in their care.  A copy of this report was sent to the requesting provider on this date.  Electronically Signed: Arn Medaldam R Babygirl Trager 08/05/2018, 3:02 PM   I spent a total of    25 Minutes in face to face in clinical consultation, greater than 50% of which was counseling/coordinating care for lower extremity superficial venous insufficiency.  Patient ID: Diana Mcpherson, female   DOB: 02-21-1955, 63 y.o.   MRN: [id number]

## 2018-08-07 ENCOUNTER — Other Ambulatory Visit: Payer: Self-pay | Admitting: Gastroenterology

## 2018-08-07 DIAGNOSIS — R1011 Right upper quadrant pain: Secondary | ICD-10-CM

## 2018-08-10 ENCOUNTER — Ambulatory Visit
Admission: RE | Admit: 2018-08-10 | Discharge: 2018-08-10 | Disposition: A | Discharge status: Home / Self Care | Payer: Managed Care, Other (non HMO) | Source: Ambulatory Visit | Attending: Gastroenterology | Admitting: Gastroenterology

## 2018-08-10 DIAGNOSIS — R1011 Right upper quadrant pain: Secondary | ICD-10-CM

## 2018-08-21 ENCOUNTER — Other Ambulatory Visit: Payer: Self-pay | Admitting: Gastroenterology

## 2018-08-21 ENCOUNTER — Telehealth: Payer: Self-pay | Admitting: Nurse Practitioner

## 2018-08-21 DIAGNOSIS — R1012 Left upper quadrant pain: Secondary | ICD-10-CM

## 2018-08-21 DIAGNOSIS — R1011 Right upper quadrant pain: Secondary | ICD-10-CM

## 2018-08-21 NOTE — Telephone Encounter (Signed)
Phone call to patient to review instructions for 13 hr prep for CT w/ contrast on 8/20 at 1220. Prescription called into [facility]. Pt aware and verbalized understanding of instructions. Prescription: 2320- 50mg  Prednisone 0520- 50mg  Prednisone 1120- 50mg  Prednisone and 50mg  Benadryl

## 2018-08-27 ENCOUNTER — Ambulatory Visit
Admission: RE | Admit: 2018-08-27 | Discharge: 2018-08-27 | Disposition: A | Discharge status: Home / Self Care | Payer: Managed Care, Other (non HMO) | Source: Ambulatory Visit | Attending: Gastroenterology | Admitting: Gastroenterology

## 2018-08-27 DIAGNOSIS — R1011 Right upper quadrant pain: Secondary | ICD-10-CM

## 2018-08-27 DIAGNOSIS — R1012 Left upper quadrant pain: Secondary | ICD-10-CM

## 2018-08-27 MED ORDER — IOPAMIDOL (ISOVUE-300) INJECTION 61%
100.0000 mL | Freq: Once | INTRAVENOUS | Status: AC | PRN
  Administered 2018-08-27: 100 mL via INTRAVENOUS

## 2019-02-23 ENCOUNTER — Ambulatory Visit (INDEPENDENT_AMBULATORY_CARE_PROVIDER_SITE_OTHER): Payer: Managed Care, Other (non HMO) | Admitting: Internal Medicine

## 2019-02-23 ENCOUNTER — Other Ambulatory Visit: Payer: Self-pay

## 2019-02-23 ENCOUNTER — Encounter: Payer: Self-pay | Admitting: Internal Medicine

## 2019-02-23 VITALS — BP 150/78 | HR 95 | Temp 97.4°F | Ht 66.0 in | Wt 201.8 lb

## 2019-02-23 DIAGNOSIS — R0602 Shortness of breath: Secondary | ICD-10-CM

## 2019-02-23 MED ORDER — ALBUTEROL SULFATE HFA 108 (90 BASE) MCG/ACT IN AERS: 1.0000 | INHALATION_SPRAY | Freq: Four times a day (QID) | RESPIRATORY_TRACT | 2 refills | Status: DC | PRN

## 2019-02-23 NOTE — Patient Instructions (Addendum)
The patient should have follow up scheduled with myself in 3 months.   Prior to next visit patient should have a full set of PFTs including:  Spirometry with bronchodilator if obstructed Lung Volumes DLCO FeNO  Flonase - 1 spray on each side of your nose twice a day for first week, then 1 spray on each side.   Instructions for use:  If you also use a saline nasal spray or rinse, use that first.  Position the head with the chin slightly tucked. Use the right hand to spray into the left nostril and the right hand to spray into the left nostril.   Point the bottle away from the septum of your nose (cartilage that divides the two sides of your nose).   Hold the nostril closed on the opposite side from where you will spray  Spray once and gently sniff to pull the medicine into the higher parts of your nose.  Don't sniff too hard as the medicine will drain down the back of your throat instead.  Repeat with a second spray on the same side if prescribed.  Repeat on the other side of your nose.  ------------------------------------------------------------------------------------------------------------------- Metered Dose Inhaler (MDI) Instructions (Albuterol)  Before using your inhaler for the first time: 1. Take the cap off the mouthpiece. 2. Shake the inhaler for 5 seconds 3. Press down on the canister to spray the medicine into the air. 4. Repeat these steps 3 more times. If you haven't used your inhaler in more than 2 weeks, repeat these steps before using it.  To use your inhaler: 1. Take the cap off the mouthpiece 2. Shake the inhaler for 5 seconds. 3. Hold it upright with your finger on the top of the canister and your thumb on the bottom of the inhaler. 4. Breathe out. 5. Close your lips around the mouthpiece. 6. As you start to inhale the next breath, press down on the canister. 7. Inhale deeply and slowly through your mouth. 8. Hold your breath for 5 to 10 seconds to keep  the medicine in your lungs. 9. Let your breath out.   10. Repeat these steps if you are supposed to take 2 puffs.  11. Put the cap back on the mouthpiece 12. Remember to rinse, gargle and spit with water after use if your inhaler has a steroid in it (Advair, Symbicort, Dulera, Qvar, Flovent)  Caring for your MDI and chamber For most MDIs, remove the canister and rinse the plastic holder with warm running water once a week to prevent the holes from getting clogged. Shake well and let air dry. There are some medications in which the inhaler cannot be removed from the holder. These usually need to be cleaned by wiping the mouthpiece with a cloth or cleaning with a dry cotton swab. Refer to the patient instructions that come with your inhaler. Clean the chamber about once a week. Remove the soft ring at the end of the chamber. Soak the spacer in warm water with a mild detergent. Carefully clean and, rinse, and shake off excess water. Do not hand dry. Allow to completely air dry. Do not store the chamber in a plastic bag.  Checking your MDI It is important that you know how much medication is left in your inhaler. The number of puffs contained in your MDI is printed on the side of the canister. After you have used that number of puffs, you must discard your inhaler even if it continues to spray. Keep track  of how many puffs you have used. You also must include priming puffs in this total. If you use an MDI every day for control of symptoms, you can determine how long it will last by dividing the total number of puffs in the MDI by the total puffs you use every day. For example: 2 puffs x 2 times per day = 4 total puffs per day. At 120 puffs, the MDI will last 30 days. If you use an inhaler only when you need to, you must keep track of how many times you spray the inhaler. Some of the newer MDIs have counting devices built in.  If your MDI does not have a dose counter, you can obtain a device that attaches  to the MDI and counts down the number of puffs each time you press the inhaler. Ask your health care professional for more information about these devices, as well as how to best keep track of your medicine without an add-on device (if you prefer).    By learning about asthma and how it can be controlled, you take an important step toward managing this disease. Work closely with your asthma care team to learn all you can about your asthma, how to avoid triggers, what your medications do, and how to take them correctly. With proper care, you can live free of asthma symptoms and maintain a normal, healthy lifestyle.   What is asthma? Asthma is a chronic disease that affects the airways of the lungs. During normal breathing, the bands of muscle that surround the airways are relaxed and air moves freely. During an asthma episode or "attack," there are three main changes that stop air from moving easily through the airways:  The bands of muscle that surround the airways tighten and make the airways narrow. This tightening is called bronchospasm.   The lining of the airways becomes swollen or inflamed.   The cells that line the airways produce more mucus, which is thicker than normal and clogs the airways.  These three factors - bronchospasm, inflammation, and mucus production - cause symptoms such as difficulty breathing, wheezing, and coughing.  What are the most common symptoms of asthma? Asthma symptoms are not the same for everyone. They can even change from episode to episode in the same person. Also, you may have only one symptom of asthma, such as cough, but another person may have all the symptoms of asthma. It is important to know all the symptoms of asthma and to be aware that your asthma can present in any of these ways at any time. The most common symptoms include: . Coughing, especially at night  . Shortness of breath  . Wheezing  . Chest tightness, pain, or pressure   Who is  affected by asthma? Asthma affects 22 million Americans; about 6 million of these are children under age 18. People who have a family history of asthma have an increased risk of developing the disease. Asthma is also more common in people who have allergies or who are exposed to tobacco smoke. However, anyone can develop asthma at any time. Some people may have asthma all of their lives, while others may develop it as adults.  What causes asthma? The airways in a person with asthma are very sensitive and react to many things, or "triggers." Contact with these triggers causes asthma symptoms. One of the most important parts of asthma control is to identify your triggers and then avoid them when possible. The only trigger you do  not want to avoid is exercise. Pre-treatment with medicines before exercise can allow you to stay active yet avoid asthma symptoms. Common asthma triggers include: 1. Infections (colds, viruses, flu, sinus infections)  2. Exercise  3. Weather (changes in temperature and/or humidity, cold air)  4. Tobacco smoke  5. Allergens (dust mites, pollens, pets, mold spores, cockroaches, and sometimes foods)  6. Irritants (strong odors from cleaning products, perfume, wood smoke, air pollution)  7. Strong emotions such as crying or laughing hard, stress, anxiety 8. Some medications   How is asthma diagnosed? To diagnose asthma, your doctor will first review your medical history, family history, and symptoms. Your doctor will want to know any past history of breathing problems you may have had, as well as a family history of asthma, allergies, eczema (a bumpy, itchy skin rash caused by allergies), or other lung disease. It is important that you describe your symptoms in detail (cough, wheeze, shortness of breath, chest tightness), including when and how often they occur. The doctor will perform a physical examination and listen to your heart and lungs. He or she may also order breathing  tests, allergy tests, blood tests, and chest and sinus X-rays. The tests will find out if you do have asthma and if there are any other conditions that are contributing factors.  How is asthma treated? Asthma can be controlled, but not cured. It is not normal to have frequent symptoms, trouble sleeping, or trouble completing tasks. Appropriate asthma care will prevent symptoms and visits to the emergency room and hospital. Asthma medicines are one of the mainstays of asthma treatment. The drugs used to treat asthma are explained below.  Anti-inflammatories: These are the most important drugs for most people with asthma. Anti-inflammatory drugs reduce swelling and mucus production in the airways. As a result, airways are less sensitive and less likely to react to triggers. These medications need to be taken daily and may need to be taken for several weeks before they begin to control asthma. Anti-inflammatory medicines lead to fewer symptoms, better airflow, less sensitive airways, less airway damage, and fewer asthma attacks. If taken every day, they CONTROL or prevent asthma symptoms.   Bronchodilators: These drugs relax the muscle bands that tighten around the airways. This action opens the airways, letting more air in and out of the lungs and improving breathing. Bronchodilators also help clear mucus from the lungs. As the airways open, the mucus moves more freely and can be coughed out more easily. In short-acting forms, bronchodilators RELIEVE or stop asthma symptoms by quickly opening the airways and are very helpful during an asthma episode. In long-acting forms, bronchodilators provide CONTROL of asthma symptoms and prevent asthma episodes.  Asthma drugs can be taken in a variety of ways. Inhaling the medications by using a metered dose inhaler, dry powder inhaler, or nebulizer is one way of taking asthma medicines. Oral medicines (pills or liquids you swallow) may also be prescribed.  Asthma  severity Asthma is classified as either "intermittent" (comes and goes) or "persistent" (lasting). Persistent asthma is further described as being mild, moderate, or severe. The severity of asthma is based on how often you have symptoms both during the day and night, as well as by the results of lung function tests and by how well you can perform activities. The "severity" of asthma refers to how "intense" or "strong" your asthma is.  Asthma control Asthma control is the goal of asthma treatment. Regardless of your asthma severity, it may or  may not be controlled. Asthma control means: . You are able to do everything you want to do at work and home  . You have no (or minimal) asthma symptoms  . You do not wake up from your sleep or earlier than usual in the morning due to asthma  . You rarely need to use your reliever medicine (inhaler)  Another major part of your treatment is that you are happy with your asthma care and believe your asthma is controlled.  Monitoring symptoms A key part of treatment is keeping track of how well your lungs are working. Monitoring your symptoms  what they are, how and when they happen, and how severe they are  is an important part of being able to control your asthma.  Sometimes asthma is monitored using a peak flow meter. A peak flow (PF) meter measures how fast the air comes out of your lungs. It can help you know when your asthma is getting worse, sometimes even before you have symptoms. By taking daily peak flow readings, you can learn when to adjust medications to keep asthma under good control. It is also used to create your asthma action plan (see below). Your doctor can use your peak flow readings to adjust your treatment plan in some cases.  Asthma Action Plan Based on your history and asthma severity, you and your doctor will develop a care plan called an "asthma action plan." The asthma action plan describes when and how to use your medicines, actions  to take when asthma worsens, and when to seek emergency care. Make sure you understand this plan. If you do not, ask your asthma care provider any questions you may have. Your asthma action plan is one of the keys to controlling asthma. Keep it readily available to remind you of what you need to do every day to control asthma and what you need to do when symptoms occur.  Goals of asthma therapy These are the goals of asthma treatment: . Live an active, normal life  . Prevent chronic and troublesome symptoms  . Attend work or school every day  . Perform daily activities without difficulty  . Stop urgent visits to the doctor, emergency department, or hospital  . Use and adjust medications to control asthma with few or no side effects

## 2019-02-23 NOTE — Progress Notes (Signed)
   Subjective:    Patient ID: Diana Mcpherson, female    DOB: [birth year], 64 y.o.   MRN: [id number]  HPI    Review of Systems  Constitutional: Positive for unexpected weight change. Negative for fever.  HENT: Positive for congestion, dental problem, ear pain, rhinorrhea, sinus pressure, sore throat and trouble swallowing. Negative for nosebleeds, postnasal drip and sneezing.   Eyes: Positive for redness. Negative for itching.  Respiratory: Positive for cough, chest tightness, shortness of breath and wheezing.   Cardiovascular: Positive for palpitations. Negative for leg swelling.  Gastrointestinal: Positive for nausea. Negative for vomiting.  Genitourinary: Negative for dysuria.  Musculoskeletal: Positive for joint swelling.  Skin: Negative for rash.  Allergic/Immunologic: Positive for environmental allergies. Negative for food allergies and immunocompromised state.  Neurological: Positive for headaches.  Hematological: Does not bruise/bleed easily.  Psychiatric/Behavioral: Negative for dysphoric mood. The patient is nervous/anxious.        Objective:   Physical Exam        Assessment & Plan:

## 2019-02-23 NOTE — Progress Notes (Signed)
Diana Mcpherson    [id number]    02-01-1955  Primary Care Physician:Beal, Christa See  Referring Physician: Eartha Inch, MD [id number] [place],  [place] [address] Reason for Consultation: Shortness of breath Date of Consultation: 02/23/2019  Chief complaint:   Chief Complaint  Patient presents with  . Consult    SOB, wheezing, pressure in chest     HPI: Last winter had URI. Heaviness in chest persisted. About six months ago started having shortness of breath. Notices symptoms when she is exercising and walking up a hill. Episodic, comes and goes. Notices wheezing when she is still, sitting down or laying down.  She has never tried an inhaler for her breathing.  Has heart burn but scared to take nexium due to reports of osteoporosis.  She has chronic post nasal drip - takes mucinex at night. Rhinitis is chronic and she runs a humidifier at night. Uses Ayr saline and flonase.  Does have anxiety especially when seeking medical care.   Social history: Occupational: She is an Tree surgeon, [employer] Smoking history: passive smoker exposure both parents in childhood. She has a remote smoking history, quit in 1981 8 years.   Social History   Occupational History  . Not on file  Tobacco Use  . Smoking status: Former Smoker    Packs/day: 1.00    Types: Cigarettes    Start date: 04/13/1972    Quit date: 04/14/1979    Years since quitting: 39.8  . Smokeless tobacco: Never Used  Substance and Sexual Activity  . Alcohol use: No    Alcohol/week: 0.0 standard drinks  . Drug use: Not on file  . Sexual activity: Not on file    Relevant family history: Family History  Problem Relation Age of Onset  . Breast cancer Maternal Aunt   . Breast cancer Paternal Aunt   . Breast cancer Paternal Aunt   . Asthma Sister     Past Medical History:  Diagnosis Date  . Allergic rhinitis   . Arthritis   . Hiatal hernia   . Hyperlipidemia   . Hypertension   . Hypothyroidism  due to Hashimoto's thyroiditis   . Macular degeneration disease   . Osteoarthritis     Past Surgical History:  Procedure Laterality Date  . APPENDECTOMY    . BACK SURGERY    . BREAST EXCISIONAL BIOPSY Left   . CHOLECYSTECTOMY    . IR EMBO VENOUS NOT HEMORR HEMANG  INC GUIDE ROADMAPPING  06/10/2017  . IR EMBO VENOUS NOT HEMORR HEMANG  INC GUIDE ROADMAPPING  07/22/2017  . IR EMBO VENOUS NOT HEMORR HEMANG  INC GUIDE ROADMAPPING  02/24/2018  . TONSILLECTOMY    . VAGINAL HYSTERECTOMY       Review of systems: Review of Systems  Constitutional: Negative for chills and fever.  HENT: Positive for congestion, sinus pain and sore throat.   Eyes: Negative for discharge and redness.  Respiratory: Positive for cough, shortness of breath and wheezing. Negative for hemoptysis.   Cardiovascular: Negative for chest pain and palpitations.  Gastrointestinal: Positive for heartburn. Negative for nausea and vomiting.  Genitourinary: Negative.   Musculoskeletal: Negative for myalgias.  Skin: Positive for rash.  Neurological: Negative for focal weakness.  Endo/Heme/Allergies: Positive for environmental allergies.  Psychiatric/Behavioral: The patient is nervous/anxious.   All other systems reviewed and are negative.   Physical Exam: Blood pressure (!) 150/78, pulse 95, temperature (!) 97.4 F (36.3 C), temperature source Temporal,  height 5\' 6"  (1.676 m), weight 201 lb 12.8 oz (91.5 kg), SpO2 97 %. Gen:      No acute distress Eyes: EOMI, sclera anicteric ENT:  no nasal polyps, mucus membranes moist, mallampati 3 Neck:     Supple, no thyromegaly Lungs:    No increased respiratory effort, symmetric chest wall excursion, clear to auscultation bilaterally, no wheezes or crackles CV:         Regular rate and rhythm; no murmurs, rubs, or gallops.  No pedal edema MSK: no acute synovitis of DIP or PIP joints, no mechanics hands.  Skin:      Warm and dry; rosacea Neuro: normal speech, no focal facial  asymmetry Psych: alert and oriented x3, normal mood and affect   Data Reviewed/Medical Decision Making:  Independent interpretation of tests: Imaging: . Review of patient's CT A/P lung bases images revealed no pulmonary process. The patient's images have been independently reviewed by me.    PFTs: No PFTs on file  . I reviewed prior external note(s) from [facility], [place] . I reviewed the result(s) of the labs and imaging as noted above.  . I have ordered PFTs  Assessment:  Shortness of Breath Possible Asthma  Plan/Recommendations: Will proceed with a full set of PFTs to evaluate for asthma. Given her persistent symptoms will trial albuterol prn.  I've instructed her to keep a diary of her symptoms upon her return.   Return to Care: Return in about 3 months (around 05/23/2019).  Lenice Llamas, MD Pul[facility] [place]  CC: Chesley Noon, MD

## 2019-02-23 NOTE — Progress Notes (Signed)
Patient seen in the office today and instructed on use of albuterol.  Patient expressed understanding and demonstrated technique. Patient teaching completed by RN Vianne Bulls, 02/23/2019

## 2019-02-26 ENCOUNTER — Other Ambulatory Visit: Payer: Self-pay | Admitting: Physician Assistant

## 2019-02-26 DIAGNOSIS — Z1231 Encounter for screening mammogram for malignant neoplasm of breast: Secondary | ICD-10-CM

## 2019-04-01 ENCOUNTER — Ambulatory Visit
Admission: RE | Admit: 2019-04-01 | Discharge: 2019-04-01 | Disposition: A | Discharge status: Home / Self Care | Payer: Managed Care, Other (non HMO) | Source: Ambulatory Visit | Attending: Physician Assistant | Admitting: Physician Assistant

## 2019-04-01 ENCOUNTER — Other Ambulatory Visit: Payer: Self-pay

## 2019-04-01 DIAGNOSIS — Z1231 Encounter for screening mammogram for malignant neoplasm of breast: Secondary | ICD-10-CM

## 2019-04-24 ENCOUNTER — Other Ambulatory Visit (HOSPITAL_COMMUNITY)
Admission: RE | Admit: 2019-04-24 | Discharge: 2019-04-24 | Disposition: A | Discharge status: Home / Self Care | Payer: Managed Care, Other (non HMO) | Source: Ambulatory Visit | Attending: Internal Medicine | Admitting: Internal Medicine

## 2019-04-24 DIAGNOSIS — Z01812 Encounter for preprocedural laboratory examination: Secondary | ICD-10-CM | POA: Insufficient documentation

## 2019-04-24 DIAGNOSIS — Z20822 Contact with and (suspected) exposure to covid-19: Secondary | ICD-10-CM | POA: Diagnosis not present

## 2019-04-24 LAB — SARS CORONAVIRUS 2 (TAT 6-24 HRS): SARS Coronavirus 2: NEGATIVE

## 2019-04-29 ENCOUNTER — Ambulatory Visit (INDEPENDENT_AMBULATORY_CARE_PROVIDER_SITE_OTHER): Payer: Managed Care, Other (non HMO) | Admitting: Internal Medicine

## 2019-04-29 ENCOUNTER — Other Ambulatory Visit: Payer: Self-pay

## 2019-04-29 DIAGNOSIS — R0602 Shortness of breath: Secondary | ICD-10-CM | POA: Diagnosis not present

## 2019-04-29 LAB — PULMONARY FUNCTION TEST
DL/VA % pred: 124 %
DL/VA: 5.11 ml/min/mmHg/L
DLCO cor % pred: 118 %
DLCO cor: 26.12 ml/min/mmHg
DLCO unc % pred: 118 %
DLCO unc: 26.12 ml/min/mmHg
FEF 25-75 Post: 2.69 L/sec
FEF 25-75 Pre: 2.73 L/sec
FEF2575-%Change-Post: -1 %
FEF2575-%Pred-Post: 114 %
FEF2575-%Pred-Pre: 116 %
FEV1-%Change-Post: 0 %
FEV1-%Pred-Post: 99 %
FEV1-%Pred-Pre: 100 %
FEV1-Post: 2.73 L
FEV1-Pre: 2.74 L
FEV1FVC-%Change-Post: 1 %
FEV1FVC-%Pred-Pre: 104 %
FEV6-%Change-Post: -1 %
FEV6-%Pred-Post: 97 %
FEV6-%Pred-Pre: 98 %
FEV6-Post: 3.33 L
FEV6-Pre: 3.39 L
FEV6FVC-%Pred-Post: 103 %
FEV6FVC-%Pred-Pre: 103 %
FVC-%Change-Post: -1 %
FVC-%Pred-Post: 93 %
FVC-%Pred-Pre: 95 %
FVC-Post: 3.34 L
FVC-Pre: 3.39 L
Post FEV1/FVC ratio: 82 %
Post FEV6/FVC ratio: 100 %
Pre FEV1/FVC ratio: 81 %
Pre FEV6/FVC Ratio: 100 %
RV % pred: 108 %
RV: 2.4 L
TLC % pred: 103 %
TLC: 5.67 L

## 2019-04-29 NOTE — Progress Notes (Signed)
Full PFT performed today. °

## 2019-05-14 IMAGING — US US EXTREM LOW VENOUS*L*
1 series · 13 of 24 positions shown · non-contrast
Comparison: 06/17/2017

CLINICAL DATA: 61-year-old with superficial venous insufficiency in
the left lower extremity. Patient underwent endovascular laser
treatment to the left great saphenous vein on 06/10/2017. Patient
presents for routine follow-up visit. Overall, symptoms in the left
lower extremity have markedly decreased. Patient continues to have
intermittent left calf pain.



[Series 1: us extrem low venous*left* · 0.07mm/px · 13 of 29 slices shown]
[im 1/29]
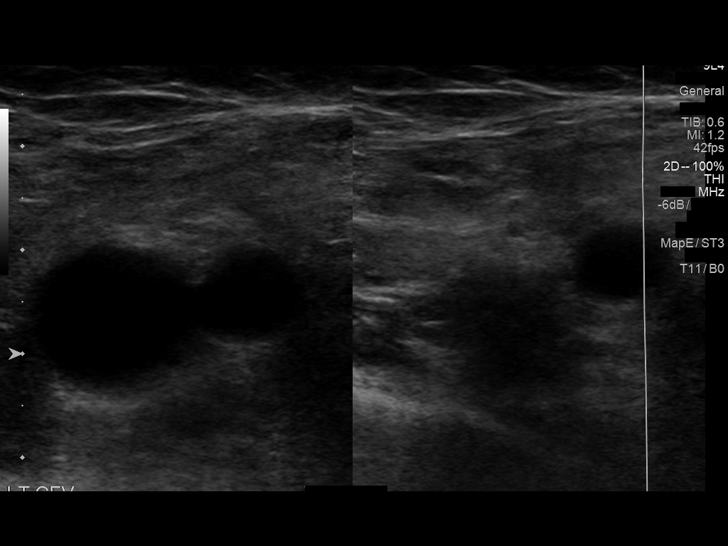
[im 3/29]
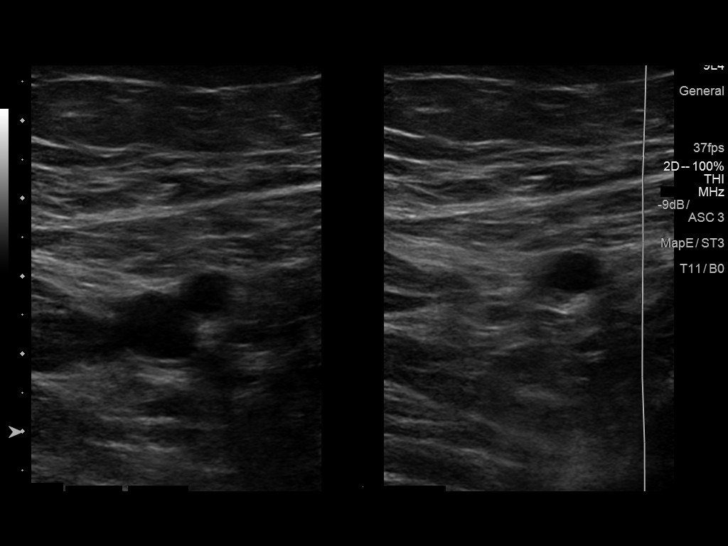
[im 5/29]
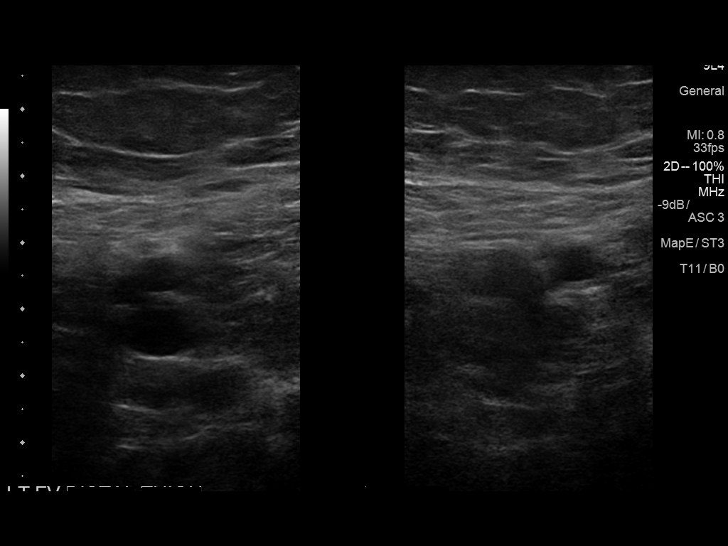
[im 8/29]
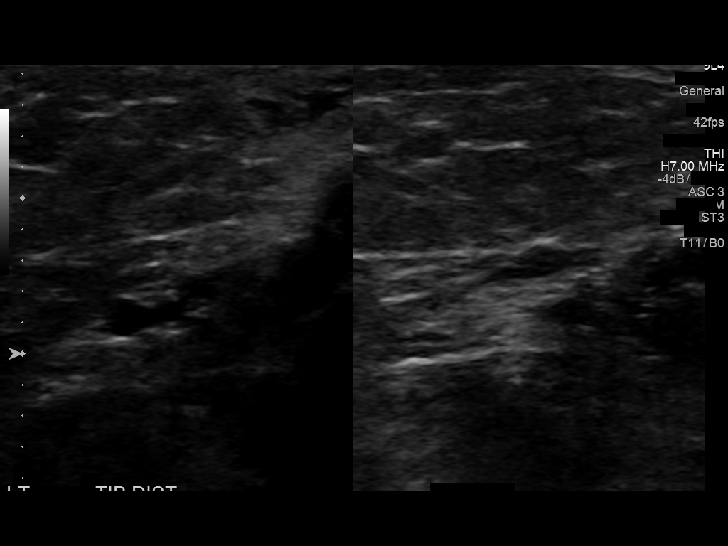
[im 10/29]
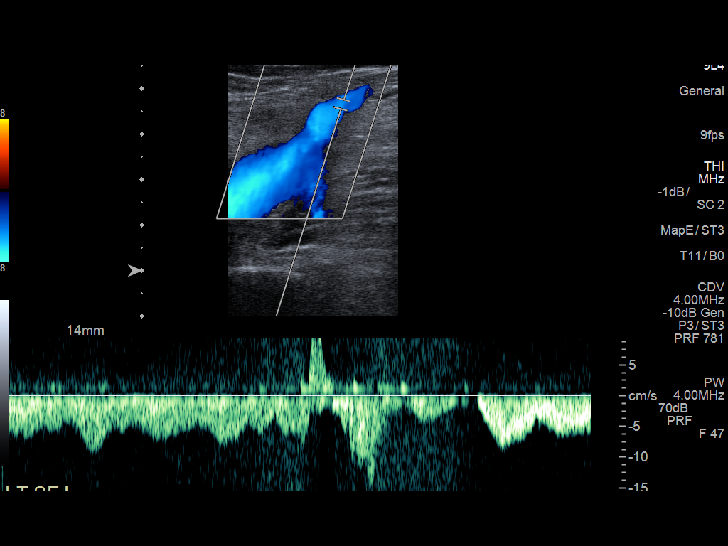
[im 13/29]
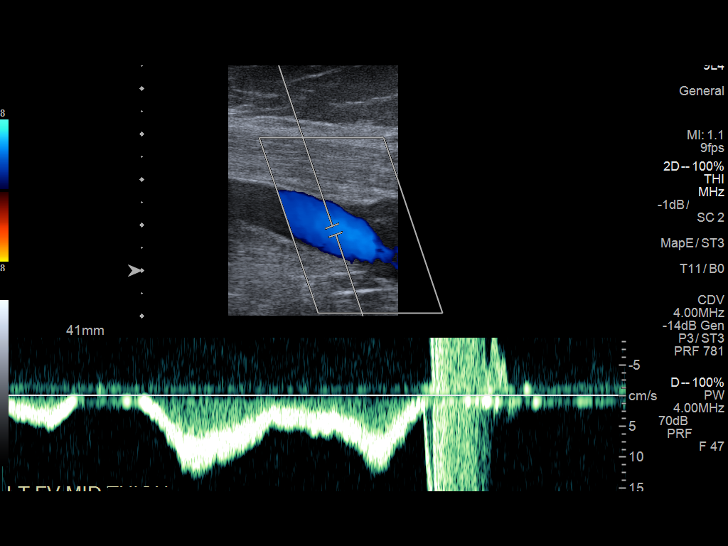
[im 15/29]
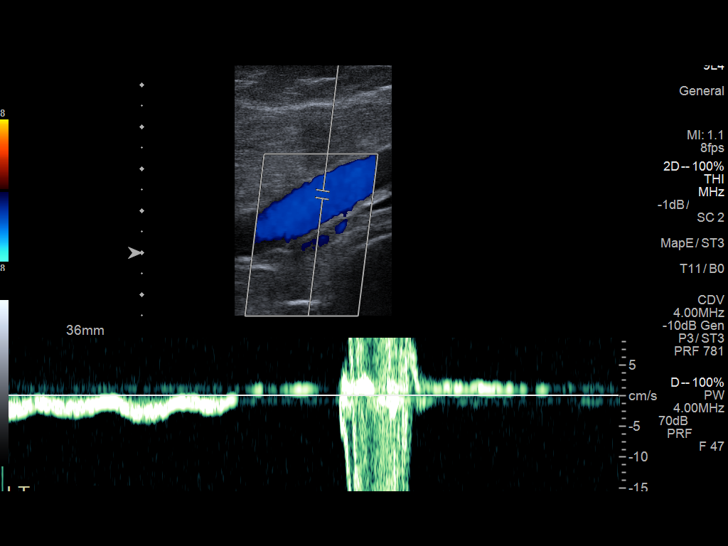
[im 16/29]
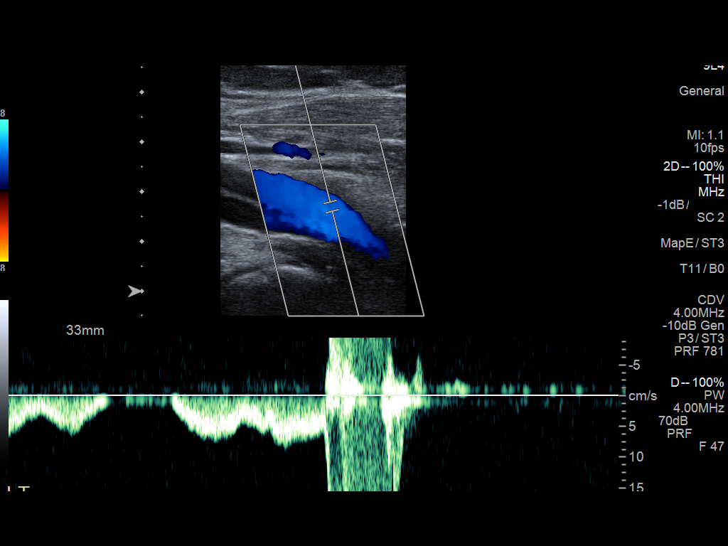
[im 19/29]
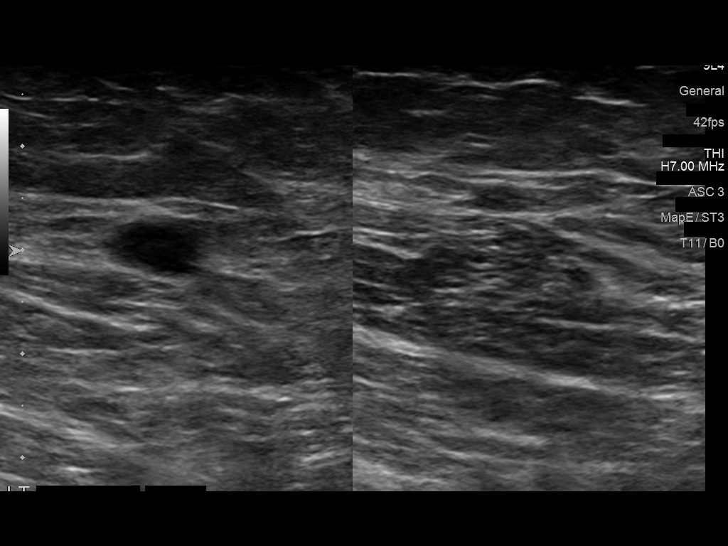
[im 21/29]
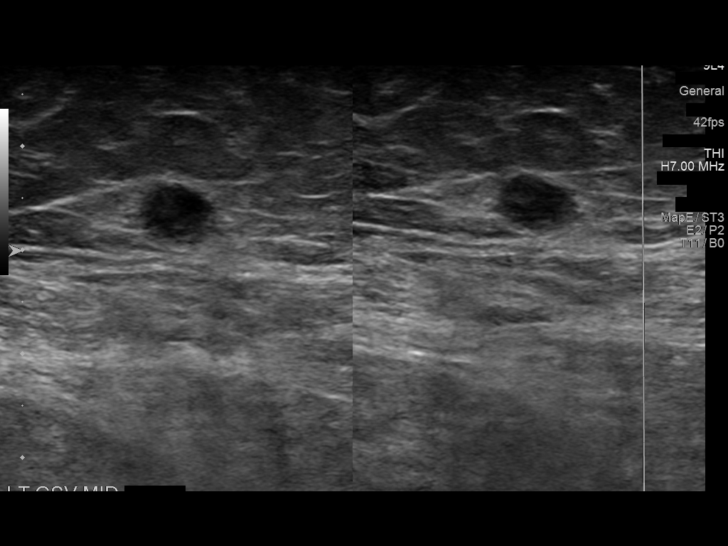
[im 24/29]
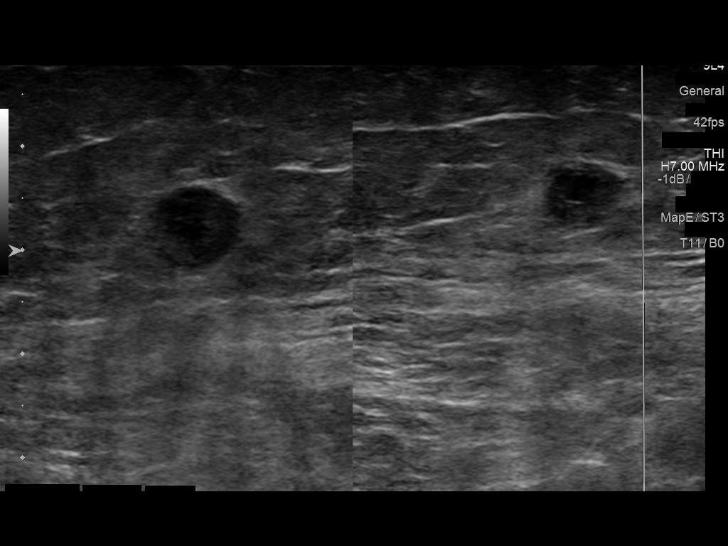
[im 26/29]
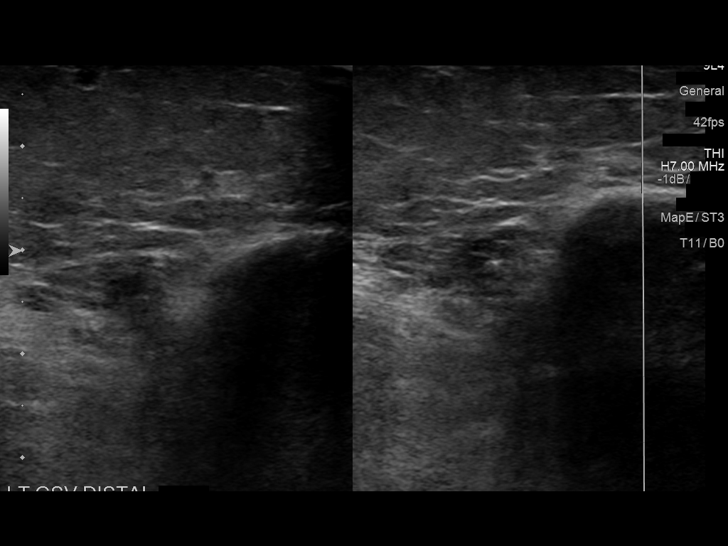
[im 29/29]
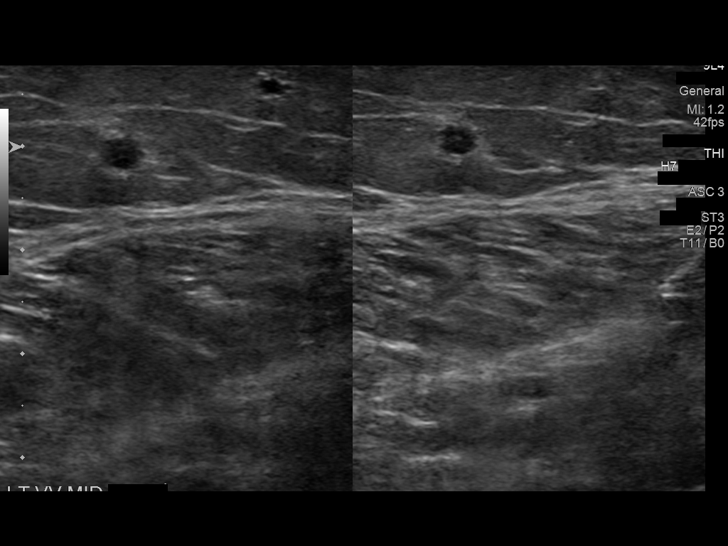

[13 of 24 positions shown; findings below may reference images not displayed]

FINDINGS: Common Femoral Vein: No evidence of thrombus. Normal
compressibility, respiratory phasicity and response to augmentation.

Saphenofemoral Junction: No evidence of thrombus. Normal
compressibility and flow on color Doppler imaging.

Profunda Femoral Vein: No evidence of thrombus. Normal
compressibility and flow on color Doppler imaging.

Femoral Vein: No evidence of thrombus. Normal compressibility,
respiratory phasicity and response to augmentation.

Popliteal Vein: No evidence of thrombus. Normal compressibility,
respiratory phasicity and response to augmentation.

Calf Veins: No evidence of thrombus. Normal compressibility in the
left posterior tibial veins.

Superficial Great Saphenous Vein: The proximal thigh great saphenous
vein is compressible and patent. The proximal/mid great saphenous
vein is not compressible and thrombosed. The distal GSV is
noncompressible and thrombosed. The great saphenous vein is occluded
in the proximal and mid calf. Distal calf left great saphenous vein
is patent and compressible. There is a branch of the proximal by GSV
which is open and tracks posteriorly and may be associated with vein
of [place].

Other Findings: Small varicosities in the mid and distal left calf
associated with a perforator branch and also connects to the
thrombosed segment of the mid calf GSV. Patient says the location of
these varicosities correspond with her intermittent pain.
IMPRESSION: Negative for deep vein thrombosis in the left lower extremity.

Majority of the treated left great saphenous vein is occluded. The
proximal thigh great saphenous vein is patent and associated with a
branch that tracks into the posterior thigh.

Small varicosities in the medial left calf associated with a
perforator branch and the left GSV.

## 2019-05-17 ENCOUNTER — Other Ambulatory Visit: Payer: Self-pay

## 2019-05-17 ENCOUNTER — Encounter: Payer: Self-pay | Admitting: Internal Medicine

## 2019-05-17 ENCOUNTER — Ambulatory Visit (INDEPENDENT_AMBULATORY_CARE_PROVIDER_SITE_OTHER): Payer: Managed Care, Other (non HMO) | Admitting: Internal Medicine

## 2019-05-17 VITALS — BP 140/80 | HR 96 | Temp 98.0°F | Ht 64.0 in | Wt 198.4 lb

## 2019-05-17 DIAGNOSIS — R05 Cough: Secondary | ICD-10-CM | POA: Diagnosis not present

## 2019-05-17 DIAGNOSIS — J310 Chronic rhinitis: Secondary | ICD-10-CM | POA: Diagnosis not present

## 2019-05-17 DIAGNOSIS — R053 Chronic cough: Secondary | ICD-10-CM

## 2019-05-17 MED ORDER — MONTELUKAST SODIUM 10 MG PO TABS: 10.0000 mg | ORAL_TABLET | Freq: Every day | ORAL | 11 refills | Status: DC

## 2019-05-17 NOTE — Progress Notes (Signed)
Diana Mcpherson    [id number]    06/13/1955  Primary Care Physician:Beal, Christa See Date of Appointment: 05/17/2019 Established Patient Visit  Chief complaint:   Chief Complaint  Patient presents with  . Follow-up    stress/reflux?  Albuterol made no difference.  No wheezing.    HPI: Diana Mcpherson is a 64 y.o. woman who presented to see me for shortness of breath, chest heaviness, and possible asthma. Started on trial of albuterol inhaler. Here today for follow up.   Interval Updates: Has stopped wheezing since the winter. Never took albuterol for wheezing, but the albuterol didn't seem to help with.   Cough - in the morning.  Has bad environmental allergies has trouble sleeping and a lot of dryness.  Takes mucinex.  Did a trial of PPI last year with no benefit  Has a lot of anxiety. Mentions that her mother had similar breathing issues.  Has difficulty with singing and talking  I have reviewed the patient's family social and past medical history and updated as appropriate.   Past Medical History:  Diagnosis Date  . Allergic rhinitis   . Arthritis   . Hiatal hernia   . Hyperlipidemia   . Hypertension   . Hypothyroidism due to Hashimoto's thyroiditis   . Macular degeneration disease   . Osteoarthritis     Past Surgical History:  Procedure Laterality Date  . APPENDECTOMY    . BACK SURGERY    . BREAST EXCISIONAL BIOPSY Left   . CHOLECYSTECTOMY    . IR EMBO VENOUS NOT HEMORR HEMANG  INC GUIDE ROADMAPPING  06/10/2017  . IR EMBO VENOUS NOT HEMORR HEMANG  INC GUIDE ROADMAPPING  07/22/2017  . IR EMBO VENOUS NOT HEMORR HEMANG  INC GUIDE ROADMAPPING  02/24/2018  . TONSILLECTOMY    . VAGINAL HYSTERECTOMY      Family History  Problem Relation Age of Onset  . Breast cancer Maternal Aunt   . Breast cancer Paternal Aunt   . Breast cancer Paternal Aunt   . Asthma Sister     Social History   Occupational History  . Not on file  Tobacco Use  . Smoking status:  Former Smoker    Packs/day: 1.00    Types: Cigarettes    Start date: 04/13/1972    Quit date: 04/14/1979    Years since quitting: 40.1  . Smokeless tobacco: Never Used  Substance and Sexual Activity  . Alcohol use: No    Alcohol/week: 0.0 standard drinks  . Drug use: Not on file  . Sexual activity: Not on file   Physical Exam: Blood pressure 140/80, pulse 96, temperature 98 F (36.7 C), temperature source Temporal, height 5\' 4"  (1.626 m), weight 198 lb 6.4 oz (90 kg), SpO2 98 %. Gen:      No acute distress ENT:  no nasal polyps, mucus membranes moist Lungs:    No increased respiratory effort, symmetric chest wall excursion, clear to auscultation bilaterally, no wheezes or crackles CV:         Regular rate and rhythm; no murmurs, rubs, or gallops.  No pedal edema  Data Reviewed:  PFTs:  PFT Results Latest Ref Rng & Units 04/29/2019  FVC-Pre L 3.39  FVC-Predicted Pre % 95  FVC-Post L 3.34  FVC-Predicted Post % 93  Pre FEV1/FVC % % 81  Post FEV1/FCV % % 82  FEV1-Pre L 2.74  FEV1-Predicted Pre % 100  FEV1-Post L 2.73  DLCO UNC% % 118  DLCO COR %Predicted % 124  TLC L 5.67  TLC % Predicted % 103  RV % Predicted % 108   I have personally reviewed the patient's PFTs and they are normal without airflow limitation, bd response. Normal lung volumes and diffusion capacity.   Labs:  Immunization status: Immunization History  Administered Date(s) Administered  . Influenza,inj,Quad PF,6-35 Mos 11/08/2018  . PFIZER SARS-COV-2 Vaccination 03/24/2019, 04/14/2019    Assessment:  Shortness of Breath - normal PFTs. No response to albuterol . Allergic Rhinitis - not well controlled GERD Cough Anxiety - not well controlled   Plan/Recommendations: Continue flonase.  Can trial singulair for rhinitis Recommend trial of PPI as recommend by Dr. Collene Mares in GI.  Next step would be referral to GI - would have her see Dr. Rowe Clack or Joya Gaskins at [facility] for vocal cord and SLP evaluation.   We  discussed treatment of her anxiety with combination of pharmacotherpy and cognitive behavioral therapy. She will discuss with PCP.   I spent 33 minutes in the care of this patient today including pre-charting, chart review, review of results, face-to-face care, coordination of care and communication with consultants etc.).  Return to Care: Return in about 5 months (around 10/17/2019).  Lenice Llamas, MD Pulmonary and [place]

## 2019-05-17 NOTE — Patient Instructions (Addendum)
Flonase - 1 spray on each side of your nose twice a day for first week, then 1 spray on each side.   Instructions for use:  If you also use a saline nasal spray or rinse, use that first.  Position the head with the chin slightly tucked. Use the right hand to spray into the left nostril and the right hand to spray into the left nostril.   Point the bottle away from the septum of your nose (cartilage that divides the two sides of your nose).   Hold the nostril closed on the opposite side from where you will spray  Spray once and gently sniff to pull the medicine into the higher parts of your nose.  Don't sniff too hard as the medicine will drain down the back of your throat instead.  Repeat with a second spray on the same side if prescribed.  Repeat on the other side of your nose.  Singulair - allergy medicine sent to your pharmacy - non sedating.   Two non-sedating over the counter allergy meds are fexofenadine (allegra) or loratidine (claritin)  Dr. Delford Field or Dr. Rubye Oaks at [facility] for ENT

## 2019-07-16 IMAGING — US IR EMBO VENOUS NOT [PERSON_NAME]  INC GUIDE ROADMAPPING
1 series · 3 of 3 positions shown · non-contrast
Comparison: none

CLINICAL DATA: 61-year-old with symptomatic right lower extremity
venous insufficiency. Patient presents for endovascular laser
treatment to the right great saphenous vein.

[Series 1: ir embo venous not (person_name) inc guide roadmap · 0.09mm/px · 2 acquisitions, 3 frames shown]
[im 1/2]
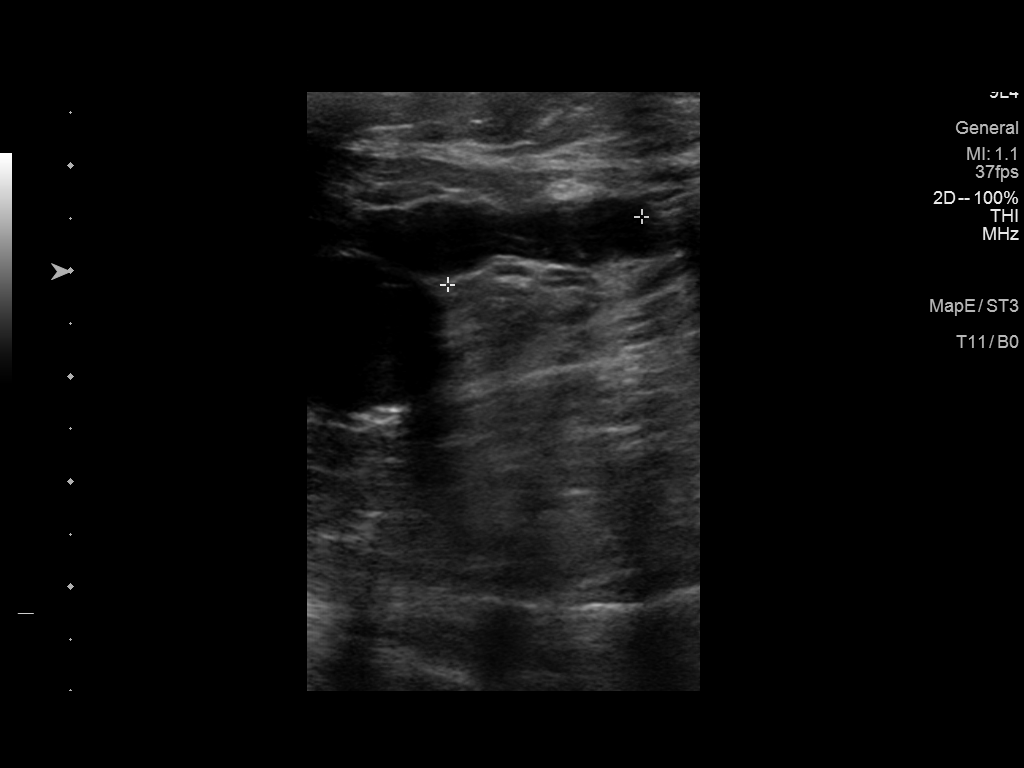
[im 2/2]
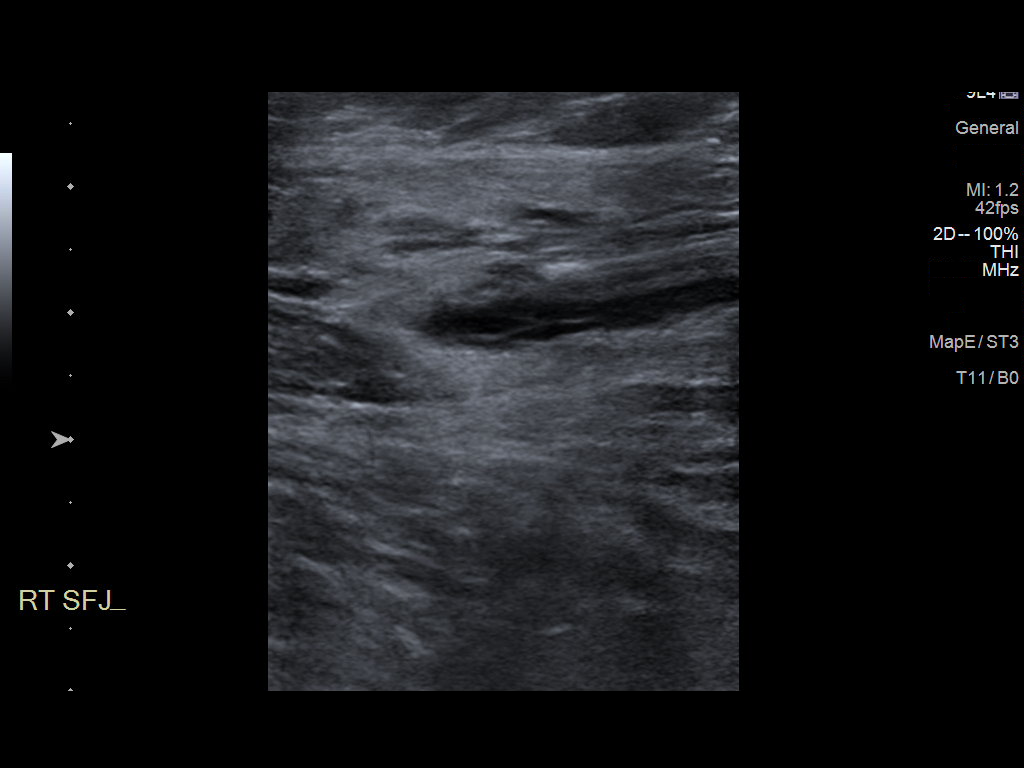
[im 2/2]
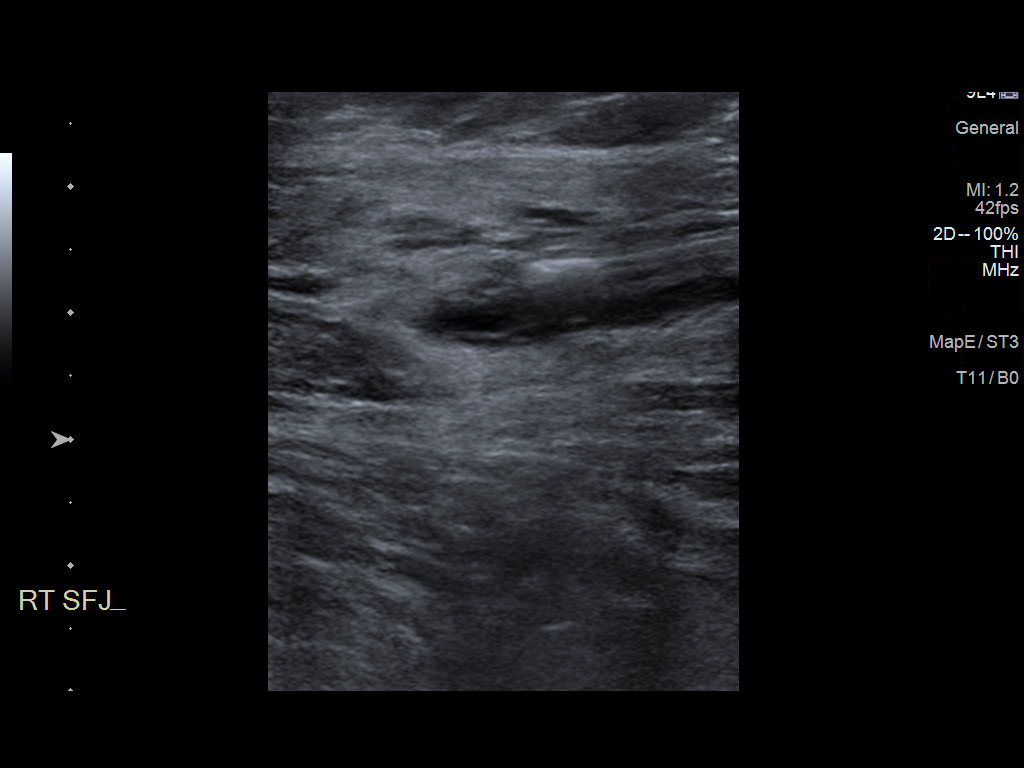

[3 of 3 positions shown; findings below may reference images not displayed]

EXAM:
ENDOVASCULAR LASER TREATMENT TO THE RIGHT GREAT SAPHENOUS VEIN.

MEDICATIONS:
MEDICATIONS
None.

ANESTHESIA/SEDATION:
Valium 13 mg oral

COMPLICATIONS:
None immediate.

PROCEDURE:
The procedure was explained to the patient. The risks and benefits
of the procedure were discussed and that patient's questions were
addressed. Informed consent was obtained from the patient. The
patient was placed in a supine position. The right lower extremity
superficial venous system was evaluated with ultrasound. The right
great saphenous vein was identified and the vein path was marked on
the skin. The right leg and right groin were prepped with
chlorhexidine and a sterile drape was placed. The skin in the
mid/lower calf was anesthetized with 1% lidocaine. A 21 gauge needle
was directed into the great saphenous vein with ultrasound guidance
and a micropuncture dilator set was placed. Wire was advanced into
the proximal right great saphenous vein. A 0.035 wire was advanced
to the saphenofemoral junction with ultrasound guidance. A 65 cm
sheath was placed over the wire and positioned distal to the
saphenofemoral junction. Dilute lidocaine was used as a tumescent.
The great saphenous vein from the percutaneous entry site to the
saphenofemoral junction was treated with 500 ml of the tumescent.
The laser treatment was performed with 8229 joules over 353 seconds.
The sheath and laser were removed as a single entity. Manual
compression was placed over the percutaneous entry site. Dressings
were placed over the puncture sites and the leg was placed in a
compression stocking.

The great saphenous vein was accessed in the mid calf and the laser
was positioned approximately 2 cm from the saphenofemoral junction.
IMPRESSION: Successful transcatheter laser occlusion of the right great
saphenous vein with ultrasound guidance.

## 2019-12-17 IMAGING — US US EXTREM LOW VENOUS*R*
1 series · 13 of 24 positions shown · non-contrast
Comparison: None.

CLINICAL DATA: 62-year-old with bilateral lower extremity venous
insufficiency. Patient has had treatment bilateral great saphenous
veins. Patient presents forfollow-up of the right lower extremity
and re-treatment of the left great saphenous vein. Right great
saphenous vein was treated with a transcatheter laser occlusion on
07/22/2017.



[Series 1: us extrem low venous*right* · 0.08mm/px · 13 of 33 slices shown]
[im 1/33]
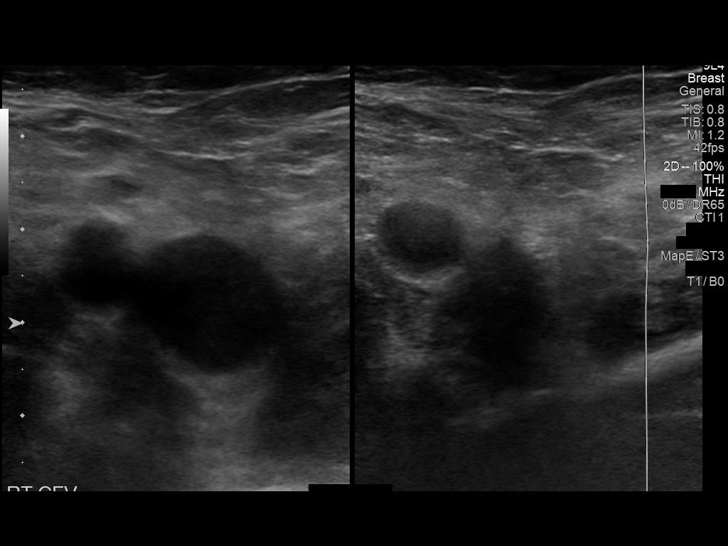
[im 3/33]
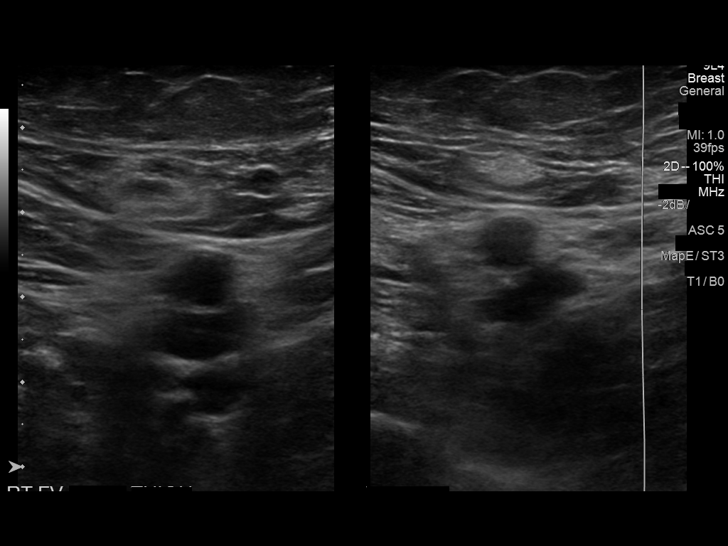
[im 6/33]
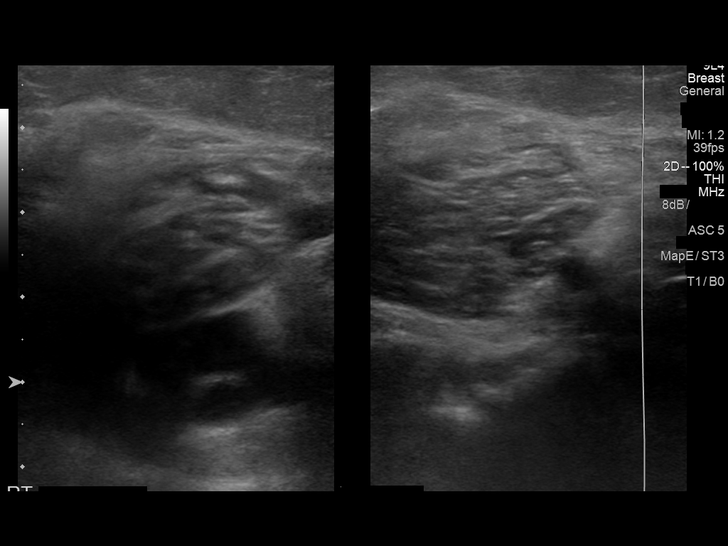
[im 9/33]
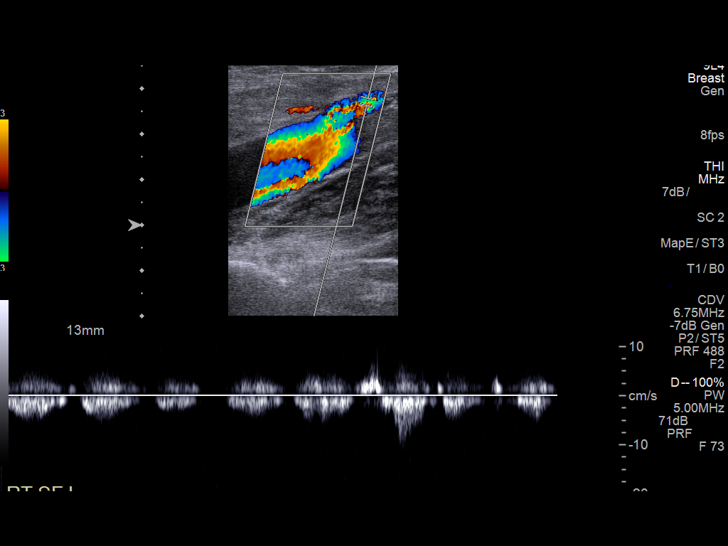
[im 12/33]
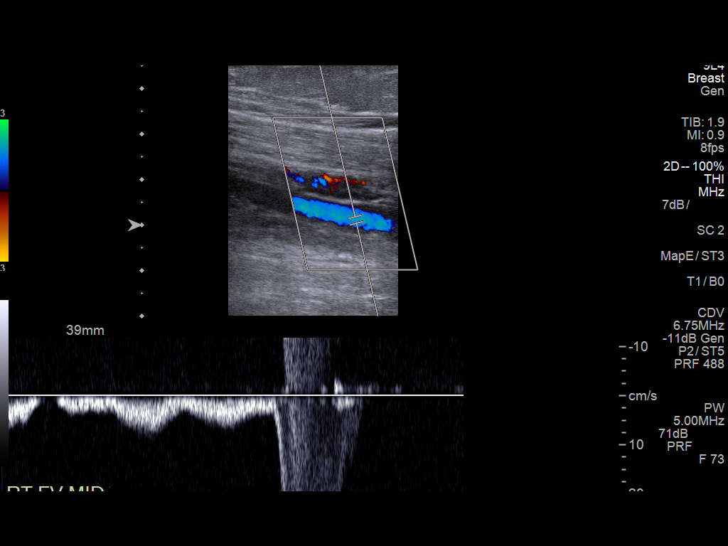
[im 14/33]
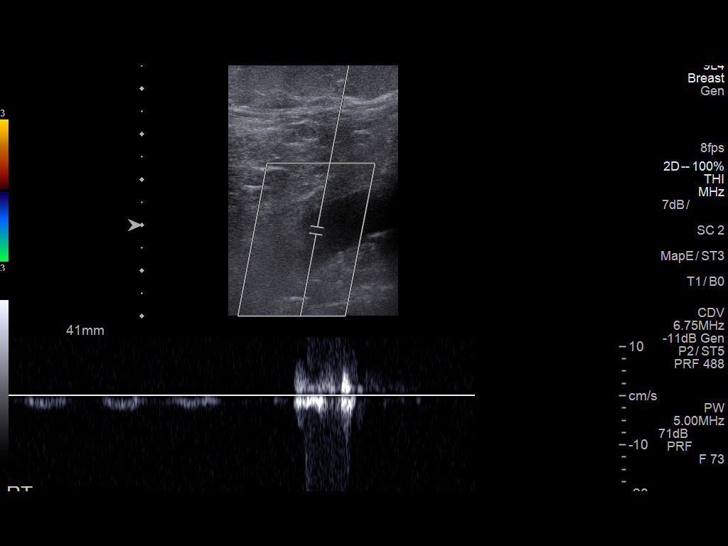
[im 17/33]
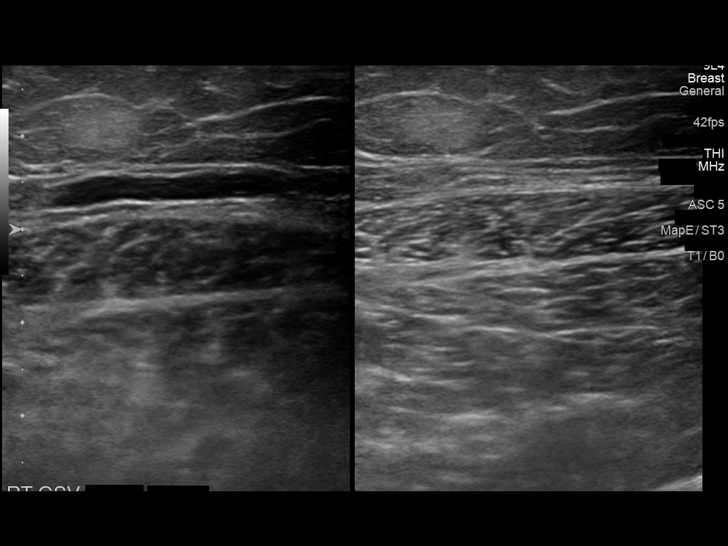
[im 19/33]
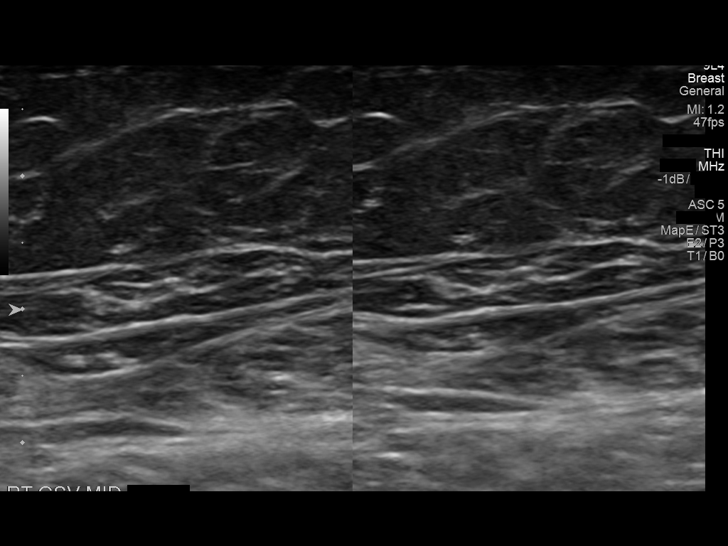
[im 21/33]
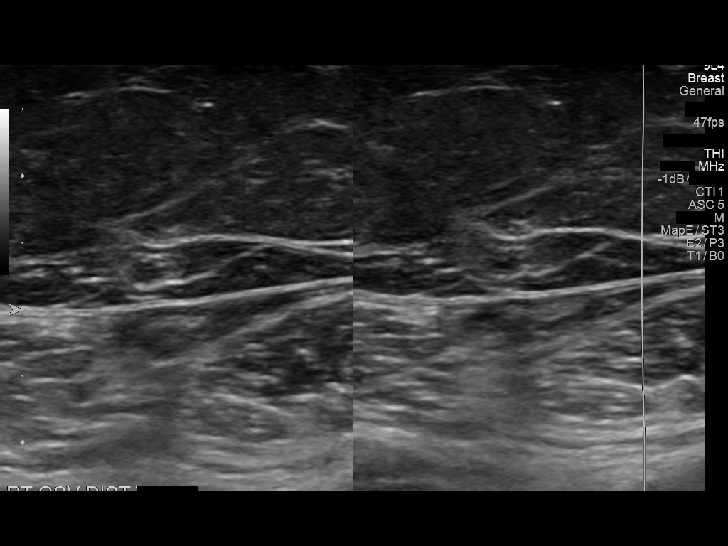
[im 24/33]
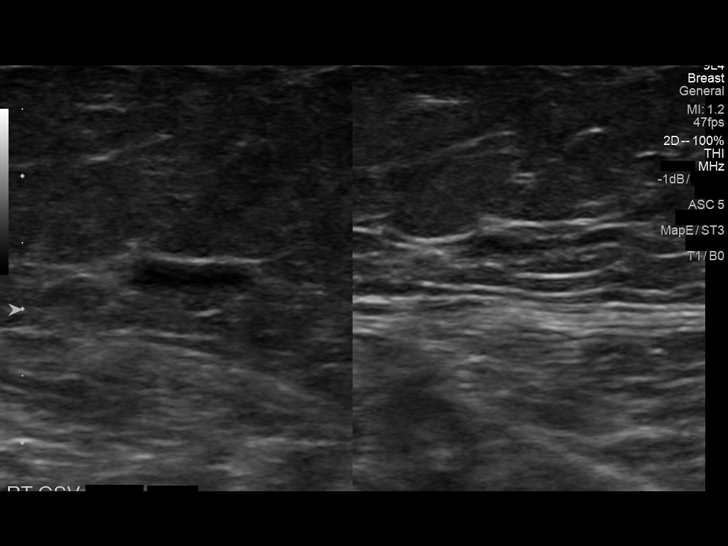
[im 27/33]
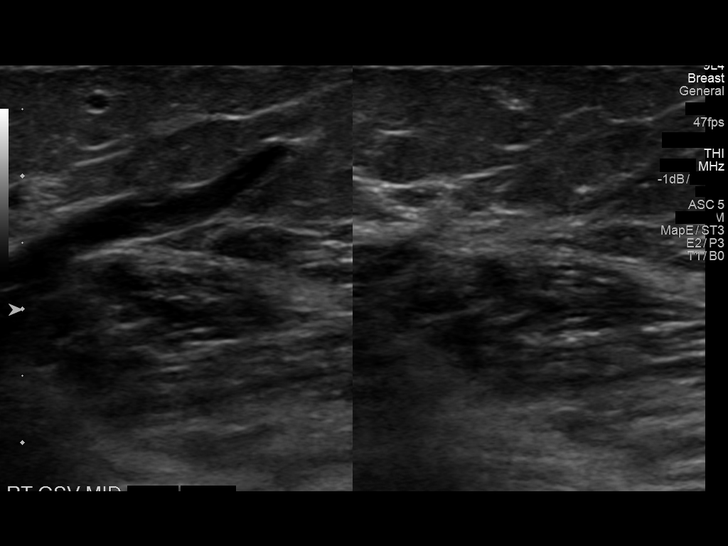
[im 30/33]
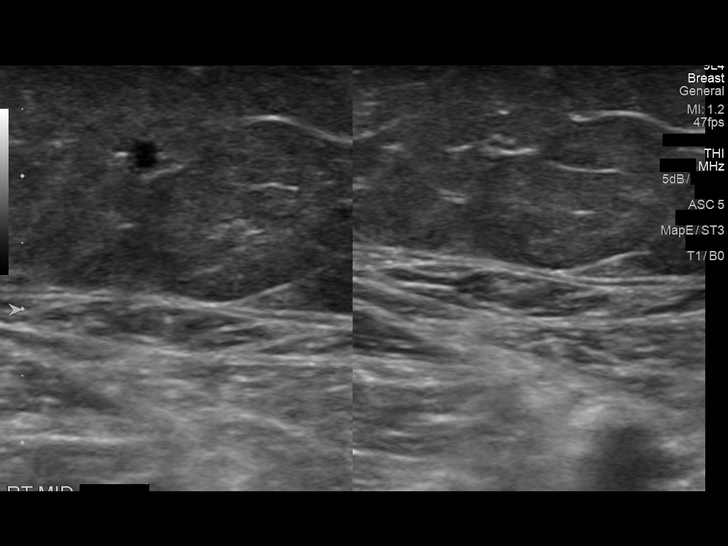
[im 33/33]
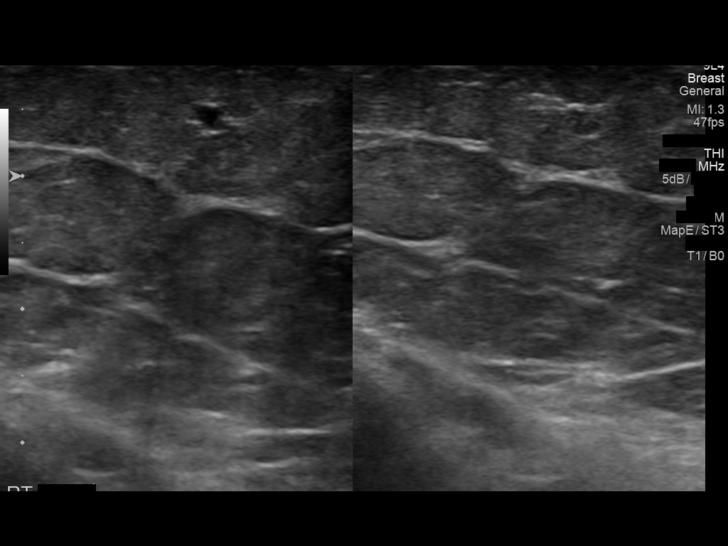

[13 of 24 positions shown; findings below may reference images not displayed]

FINDINGS: Contralateral Common Femoral Vein: Respiratory phasicity is normal
and symmetric with the symptomatic side. No evidence of thrombus.
Normal compressibility.

Common Femoral Vein: No evidence of thrombus. Normal
compressibility, respiratory phasicity and response to augmentation.

Saphenofemoral Junction: No evidence of thrombus. Normal
compressibility and flow on color Doppler imaging.

Profunda Femoral Vein: No evidence of thrombus. Normal
compressibility and flow on color Doppler imaging.

Femoral Vein: No evidence of thrombus. Normal compressibility,
respiratory phasicity and response to augmentation.

Popliteal Vein: No evidence of thrombus. Normal compressibility,
respiratory phasicity and response to augmentation.

Superficial Great Saphenous Vein: The treated right great saphenous
vein is occluded in the proximal thigh, mid thigh, distal thigh,
knee, proximal calf and mid calf. There is a patent venous branch
along the anterior right thigh but this is separate from the great
saphenous vein.

Other Findings:  No large varicosities.
IMPRESSION: 1. Treated right great saphenous vein is occluded. No large
varicosities in right lower extremity.
2.  Negative for deep venous thrombosis in right lower extremity.

## 2020-02-18 IMAGING — US IR EMBO VENOUS NOT [PERSON_NAME]  INC GUIDE ROADMAPPING
1 series · 7 of 7 positions shown · non-contrast
Comparison: none

CLINICAL DATA: 62-year-old with symptomatic left lower extremity
venous insufficiency. Previously treated left great saphenous vein
has recanalized. There are symptomatic varicose veins in the left
calf. Plan for endovascular laser treatment to the left great
saphenous vein and ultrasound-guided foam sclerotherapy to left calf
varicosities

[Series 1: ir embo venous not (person_name) inc guide roadmap · 4 acquisitions, 7 frames shown]
[im 1/4]
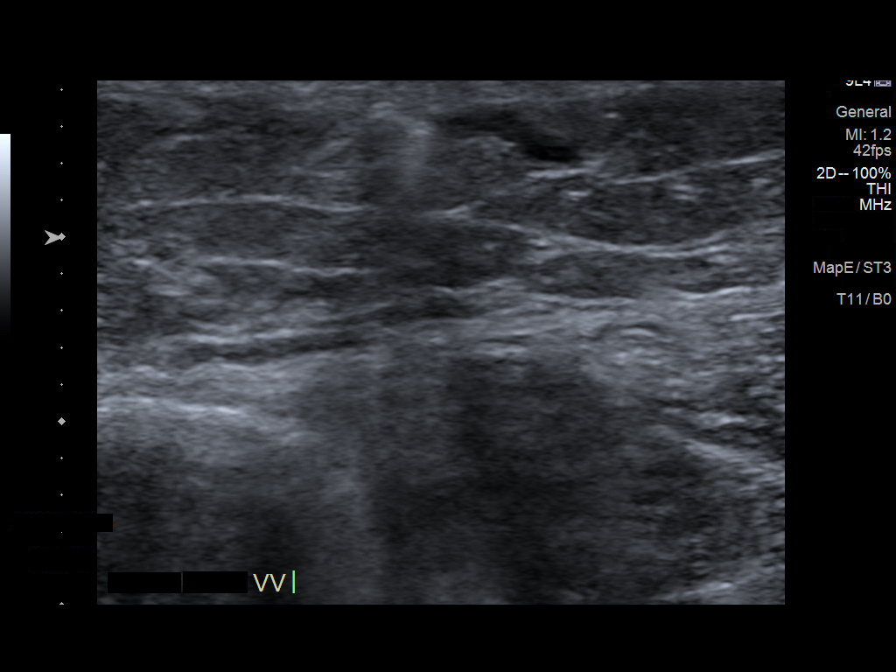
[im 1/4]
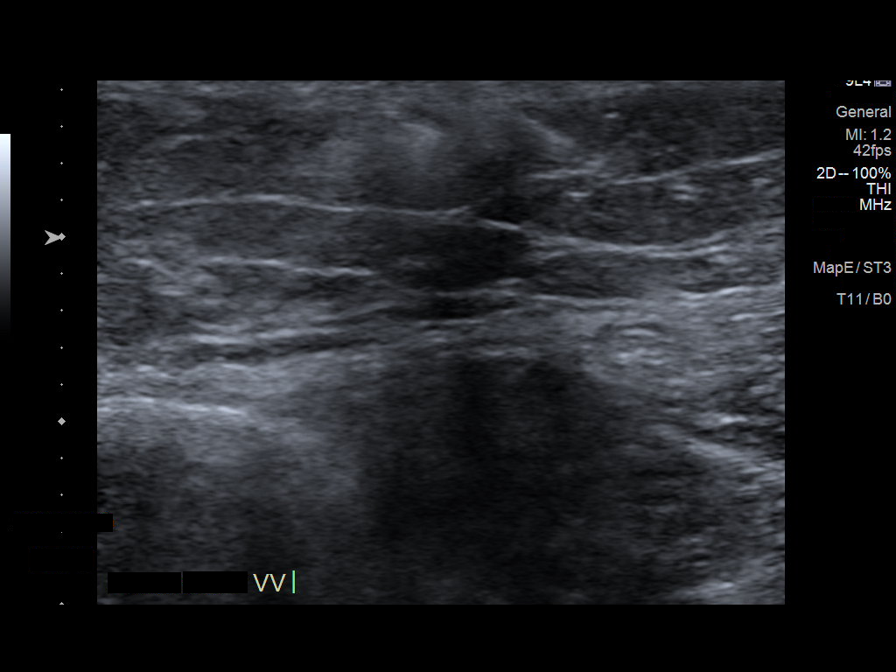
[im 2/4]
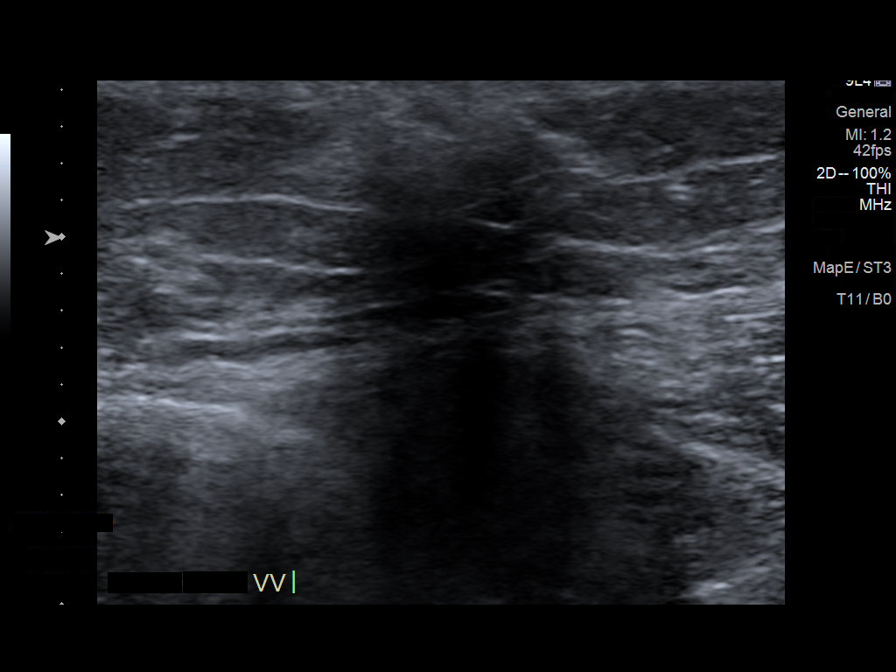
[im 2/4]
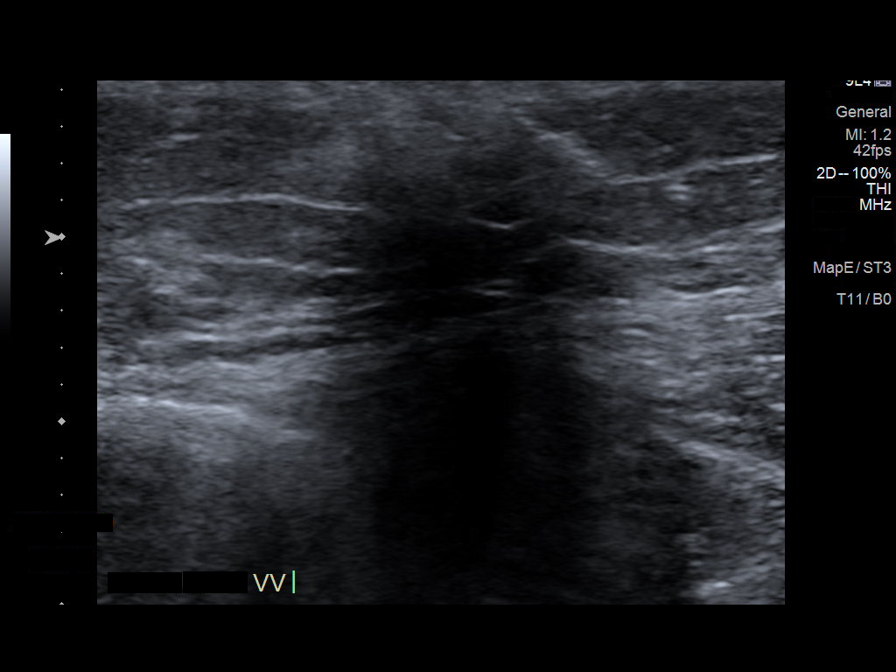
[im 3/4]
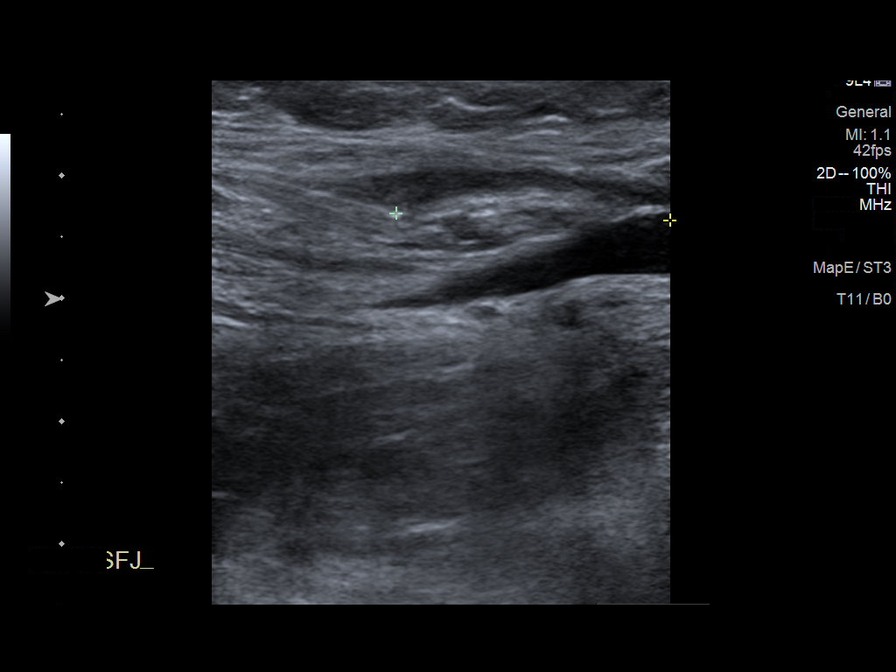
[im 3/4]
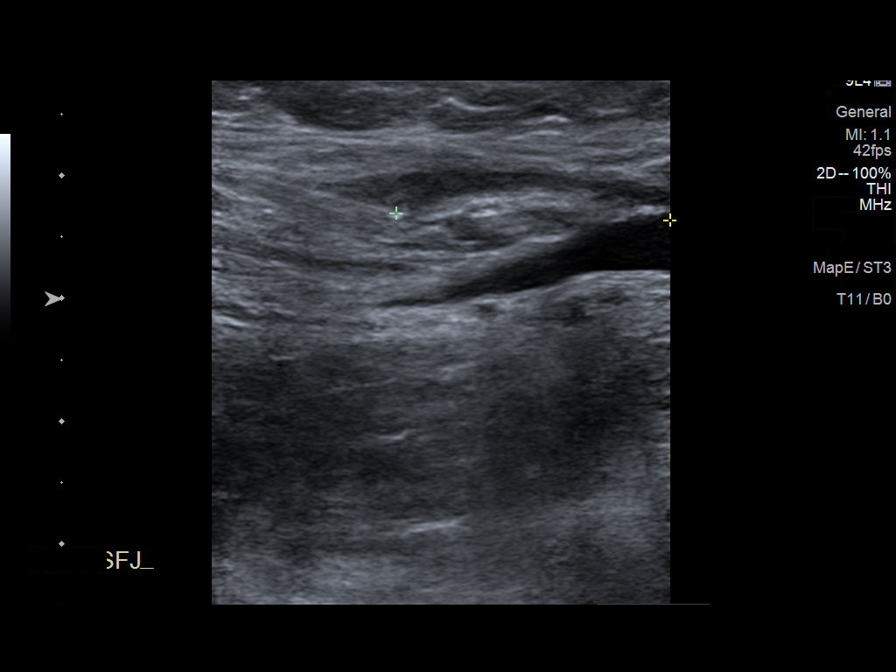
[im 4/4]
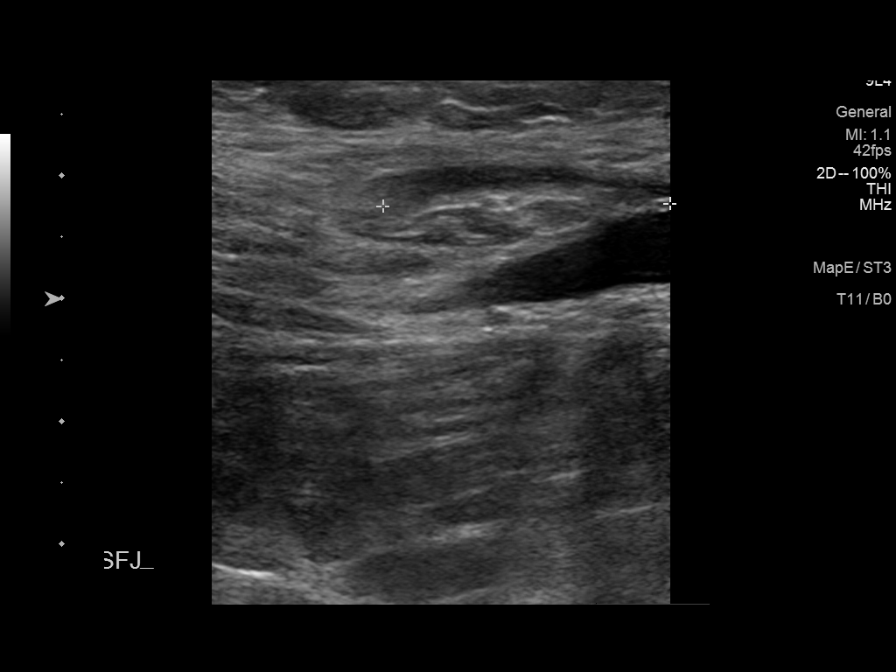

[7 of 7 positions shown; findings below may reference images not displayed]

EXAM:
ENDOVASCULAR LASER TREATMENT TO THE LEFT GREAT SAPHENOUS VEIN.

ULTRASOUND-GUIDED FOAM SCLEROTHERAPY TO LEFT CALF VARICOSITIES

MEDICATIONS:
MEDICATIONS
None.

ANESTHESIA/SEDATION:
Volume 15 mg p.o.

COMPLICATIONS:
None immediate.

PROCEDURE:
The procedure was explained to the patient. The risks and benefits
of the procedure were discussed and that patient's questions were
addressed. Informed consent was obtained from the patient. The
patient was placed in a supine position. The left lower extremity
superficial venous system was evaluated with ultrasound. The left
great saphenous vein was identified and the vein path was marked on
the skin. The left leg and left groin were prepped with
chlorhexidine and a sterile drape was placed. The skin below the
left knee was anesthetized with 1% lidocaine.

Patent varicose veins in the medial left calf for targeted for foam
sclerotherapy. 1% polydochol was combined with room air to create a
foam. 25 gauge needle was directed into a varicosity with ultrasound
guidance. The foam sclerosant was injected under ultrasound
guidance. Foam sclerosant was identified within the varicosities and
actually extending into the recanalized left great saphenous vein.

Attention was directed to the left great saphenous vein. A 21 gauge
needle was directed into the great saphenous vein below the knee
with ultrasound guidance and a micropuncture dilator set was placed.
Wire was advanced into the proximal left great saphenous vein. A
0.035 wire was advanced to the saphenofemoral junction with
ultrasound guidance. A 65 cm cm sheath was placed over the wire and
positioned distal to the saphenofemoral junction. Dilute lidocaine
was used as a tumescent. The great saphenous vein from the
percutaneous entry site to the saphenofemoral junction was treated
with 450 ml of the tumescent. The laser treatment was performed with
1498.6 joules over 250 seconds. The sheath and laser were removed as
a single entity. Manual compression was placed over the percutaneous
entry site. Dressings were placed over the puncture sites and the
leg was placed in a compression stocking.

The great saphenous vein was accessed below the knee and the laser
was positioned great than 2 cm from the saphenofemoral junction.
IMPRESSION: Successful transcatheter laser occlusion of the left great saphenous
vein with ultrasound guidance.

Successful ultrasound-guided foam sclerotherapy to varicosities in
the left calf.

## 2020-02-23 ENCOUNTER — Other Ambulatory Visit: Payer: Self-pay | Admitting: Physician Assistant

## 2020-02-23 DIAGNOSIS — Z1231 Encounter for screening mammogram for malignant neoplasm of breast: Secondary | ICD-10-CM

## 2020-02-25 ENCOUNTER — Other Ambulatory Visit: Payer: Self-pay | Admitting: Physician Assistant

## 2020-02-25 DIAGNOSIS — M8580 Other specified disorders of bone density and structure, unspecified site: Secondary | ICD-10-CM

## 2020-02-28 ENCOUNTER — Other Ambulatory Visit: Payer: Self-pay | Admitting: Pain Medicine

## 2020-02-28 DIAGNOSIS — R109 Unspecified abdominal pain: Secondary | ICD-10-CM

## 2020-03-17 ENCOUNTER — Ambulatory Visit
Admission: RE | Admit: 2020-03-17 | Discharge: 2020-03-17 | Disposition: A | Discharge status: Home / Self Care | Payer: Managed Care, Other (non HMO) | Source: Ambulatory Visit | Attending: Pain Medicine | Admitting: Pain Medicine

## 2020-03-17 ENCOUNTER — Other Ambulatory Visit: Payer: Self-pay

## 2020-03-17 DIAGNOSIS — R109 Unspecified abdominal pain: Secondary | ICD-10-CM

## 2020-03-17 MED ORDER — GADOBENATE DIMEGLUMINE 529 MG/ML IV SOLN
14.0000 mL | Freq: Once | INTRAVENOUS | Status: AC | PRN
  Administered 2020-03-17: 14 mL via INTRAVENOUS

## 2020-04-13 ENCOUNTER — Other Ambulatory Visit: Payer: Self-pay

## 2020-04-13 ENCOUNTER — Ambulatory Visit
Admission: RE | Admit: 2020-04-13 | Discharge: 2020-04-13 | Disposition: A | Discharge status: Home / Self Care | Payer: Managed Care, Other (non HMO) | Source: Ambulatory Visit | Attending: Physician Assistant | Admitting: Physician Assistant

## 2020-04-13 DIAGNOSIS — Z1231 Encounter for screening mammogram for malignant neoplasm of breast: Secondary | ICD-10-CM

## 2020-08-02 ENCOUNTER — Ambulatory Visit
Admission: RE | Admit: 2020-08-02 | Discharge: 2020-08-02 | Disposition: A | Discharge status: Home / Self Care | Payer: Managed Care, Other (non HMO) | Source: Ambulatory Visit | Attending: Physician Assistant | Admitting: Physician Assistant

## 2020-08-02 ENCOUNTER — Other Ambulatory Visit: Payer: Self-pay

## 2020-08-02 DIAGNOSIS — M8580 Other specified disorders of bone density and structure, unspecified site: Secondary | ICD-10-CM

## 2020-08-03 IMAGING — US ULTRASOUND ABDOMEN LIMITED
1 series · 14 of 25 positions shown · non-contrast
Comparison: None.

CLINICAL DATA: Right upper quadrant pain

EXAM:
ULTRASOUND ABDOMEN LIMITED RIGHT UPPER QUADRANT

[Series 1: ultrasound abdomen limited · 0.26mm/px · 14 of 30 slices shown]
[im 1/30]
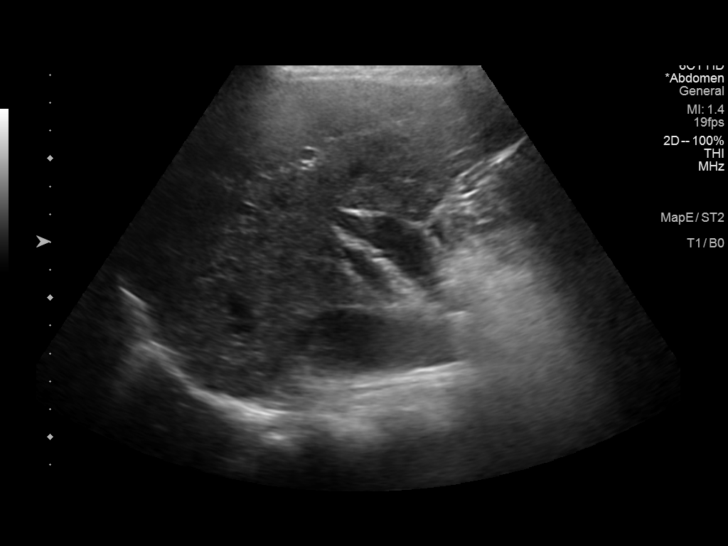
[im 3/30]
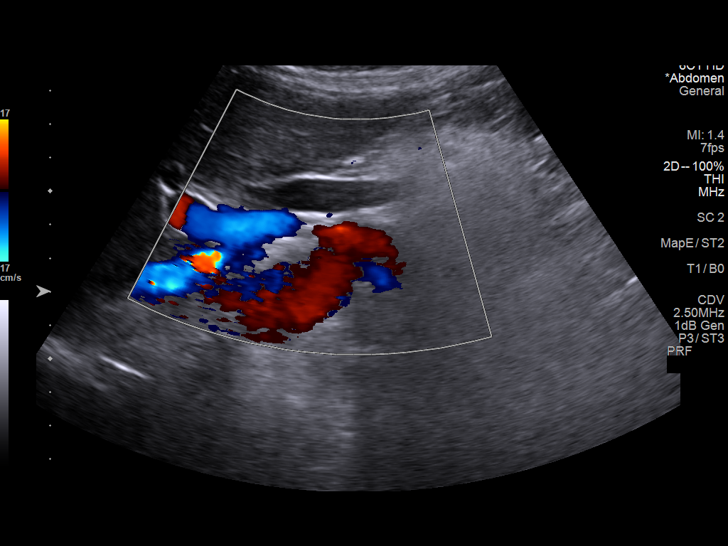
[im 5/30]
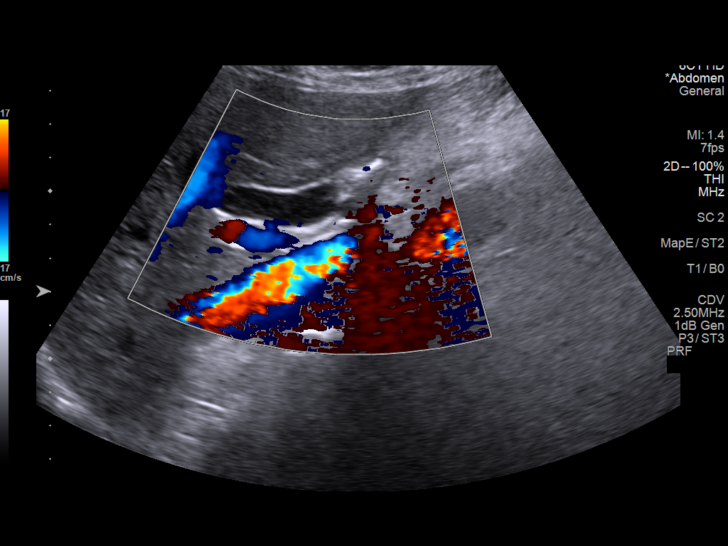
[im 8/30]
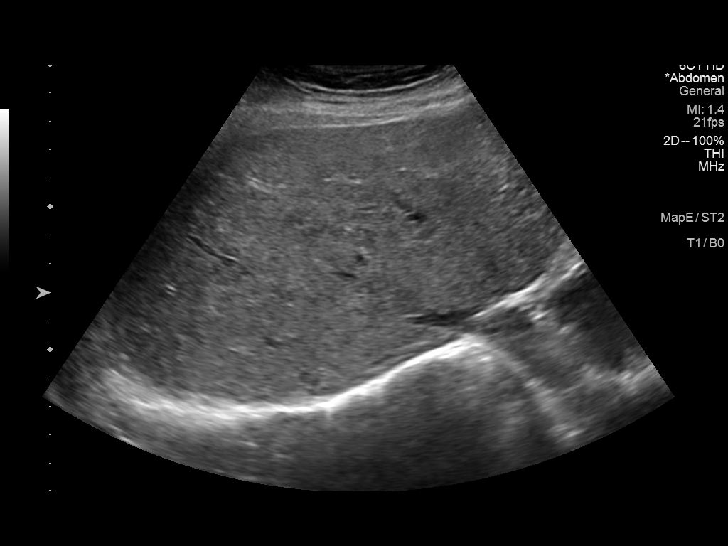
[im 10/30]
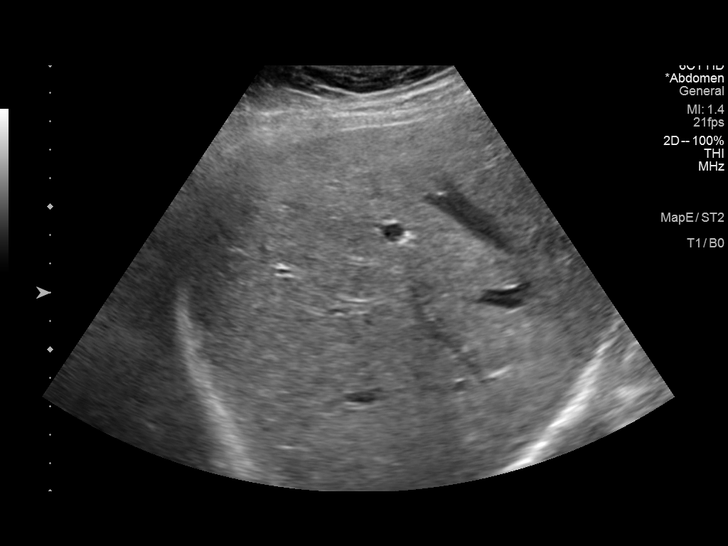
[im 11/30]
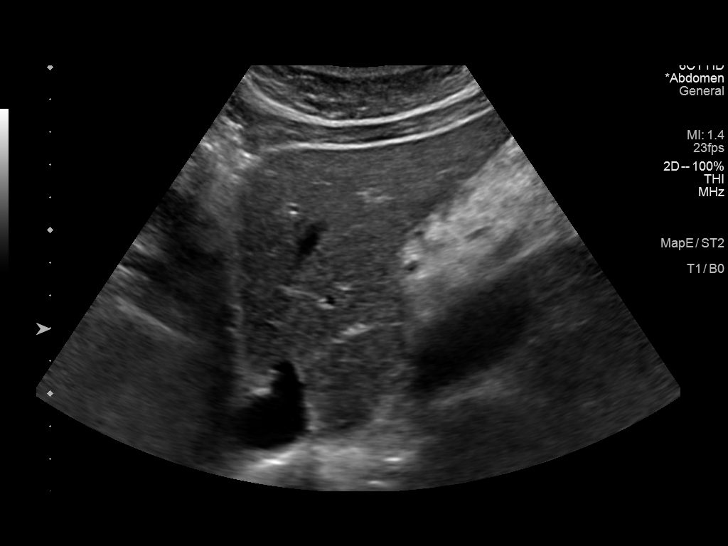
[im 14/30]
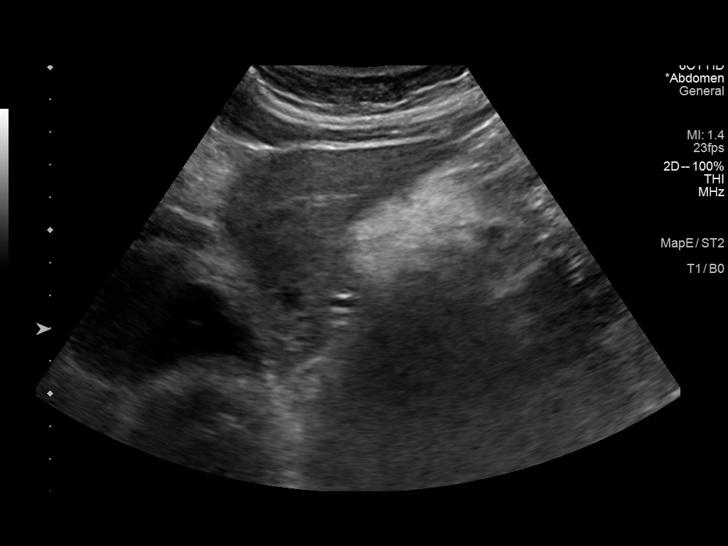
[im 16/30]
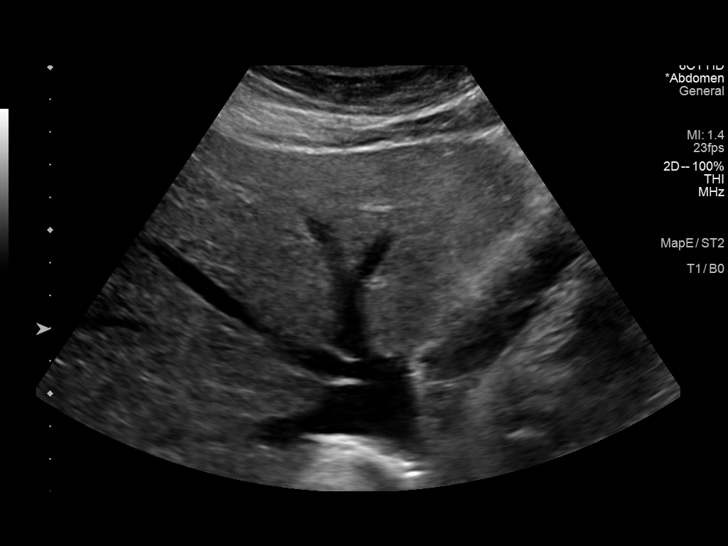
[im 19/30]
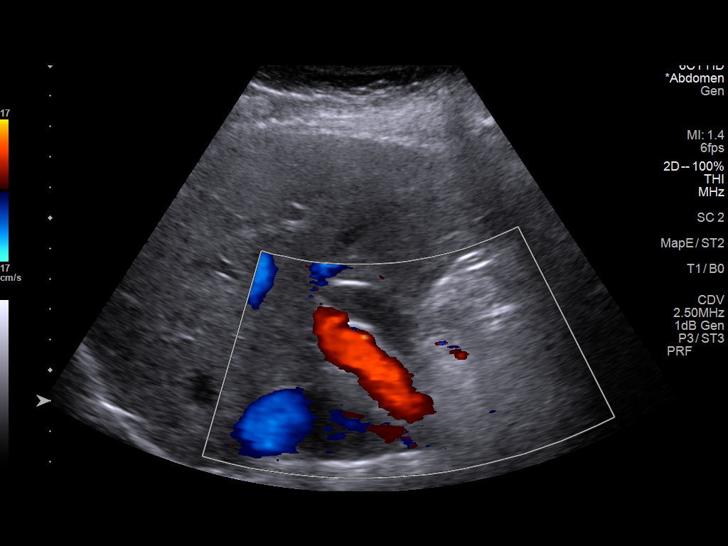
[im 20/30]
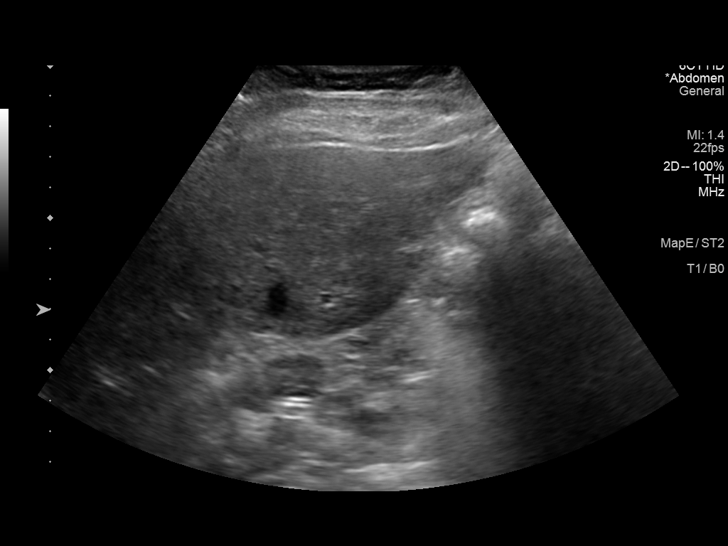
[im 22/30]
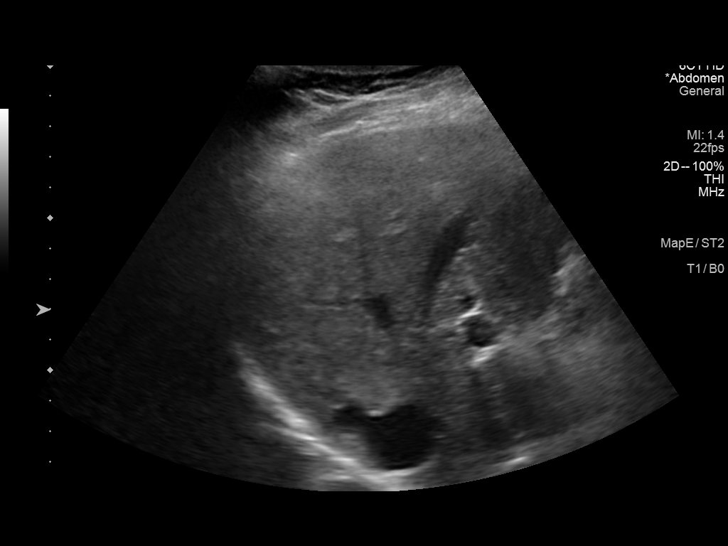
[im 25/30]
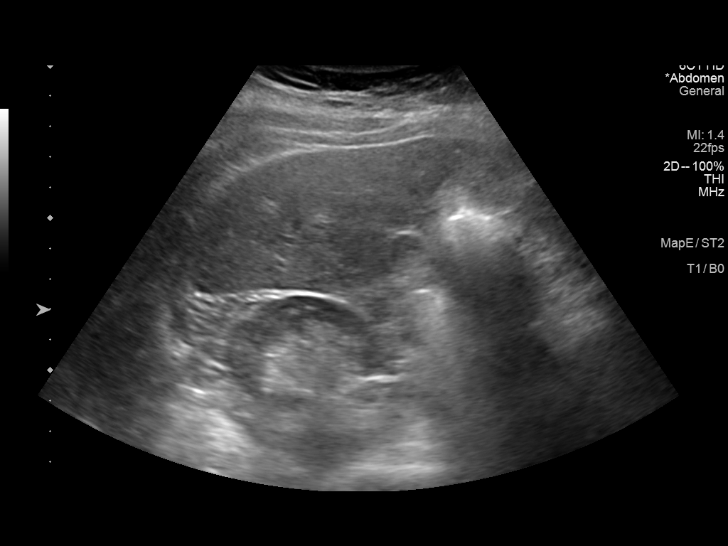
[im 27/30]
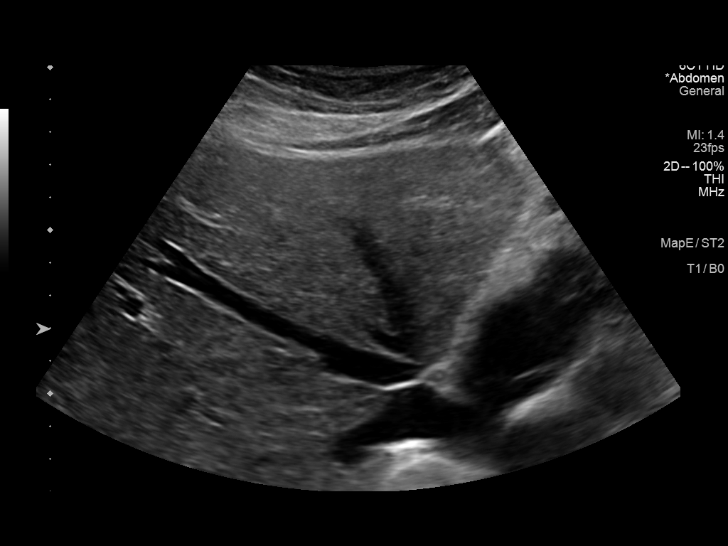
[im 30/30]
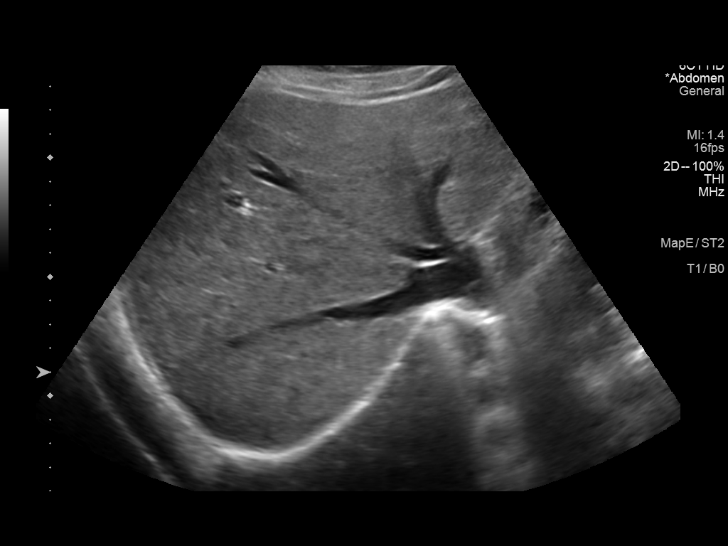

[14 of 25 positions shown; findings below may reference images not displayed]

FINDINGS: Gallbladder:

Surgically absent.

Common bile duct:

Diameter: 8 mm, a value within normal limits for post
cholecystectomy state. No intrahepatic biliary duct dilatation.

Liver:

No focal lesion identified. Within normal limits in parenchymal
echogenicity. Portal vein is patent on color Doppler imaging with
normal direction of blood flow towards the liver.

Other: None.
IMPRESSION: Gallbladder absent.  Study otherwise unremarkable.

## 2021-02-16 ENCOUNTER — Other Ambulatory Visit: Payer: Self-pay | Admitting: Neurological Surgery

## 2021-02-16 DIAGNOSIS — M533 Sacrococcygeal disorders, not elsewhere classified: Secondary | ICD-10-CM

## 2021-03-05 ENCOUNTER — Other Ambulatory Visit: Payer: Self-pay | Admitting: Physician Assistant

## 2021-03-05 DIAGNOSIS — Z1231 Encounter for screening mammogram for malignant neoplasm of breast: Secondary | ICD-10-CM

## 2021-03-12 ENCOUNTER — Other Ambulatory Visit: Payer: Managed Care, Other (non HMO)

## 2021-03-27 ENCOUNTER — Ambulatory Visit
Admission: RE | Admit: 2021-03-27 | Discharge: 2021-03-27 | Disposition: A | Discharge status: Home / Self Care | Payer: Managed Care, Other (non HMO) | Source: Ambulatory Visit | Attending: Neurological Surgery | Admitting: Neurological Surgery

## 2021-03-27 ENCOUNTER — Other Ambulatory Visit: Payer: Self-pay

## 2021-03-27 DIAGNOSIS — M533 Sacrococcygeal disorders, not elsewhere classified: Secondary | ICD-10-CM

## 2021-04-16 ENCOUNTER — Ambulatory Visit: Payer: Managed Care, Other (non HMO)

## 2021-04-30 ENCOUNTER — Ambulatory Visit
Admission: RE | Admit: 2021-04-30 | Discharge: 2021-04-30 | Disposition: A | Discharge status: Home / Self Care | Payer: Managed Care, Other (non HMO) | Source: Ambulatory Visit | Attending: Physician Assistant | Admitting: Physician Assistant

## 2021-04-30 DIAGNOSIS — Z1231 Encounter for screening mammogram for malignant neoplasm of breast: Secondary | ICD-10-CM

## 2021-05-03 ENCOUNTER — Other Ambulatory Visit: Payer: Self-pay | Admitting: Neurological Surgery

## 2021-05-03 DIAGNOSIS — M533 Sacrococcygeal disorders, not elsewhere classified: Secondary | ICD-10-CM

## 2021-05-28 ENCOUNTER — Ambulatory Visit
Admission: RE | Admit: 2021-05-28 | Discharge: 2021-05-28 | Disposition: A | Discharge status: Home / Self Care | Payer: Managed Care, Other (non HMO) | Source: Ambulatory Visit | Attending: Neurological Surgery | Admitting: Neurological Surgery

## 2021-05-28 DIAGNOSIS — M533 Sacrococcygeal disorders, not elsewhere classified: Secondary | ICD-10-CM

## 2021-06-19 ENCOUNTER — Other Ambulatory Visit: Payer: Self-pay | Admitting: Neurological Surgery

## 2021-06-19 DIAGNOSIS — M48061 Spinal stenosis, lumbar region without neurogenic claudication: Secondary | ICD-10-CM

## 2021-06-30 ENCOUNTER — Ambulatory Visit
Admission: RE | Admit: 2021-06-30 | Discharge: 2021-06-30 | Disposition: A | Discharge status: Home / Self Care | Payer: Managed Care, Other (non HMO) | Source: Ambulatory Visit | Attending: Neurological Surgery | Admitting: Neurological Surgery

## 2021-06-30 DIAGNOSIS — M48061 Spinal stenosis, lumbar region without neurogenic claudication: Secondary | ICD-10-CM

## 2021-07-11 ENCOUNTER — Other Ambulatory Visit: Payer: Self-pay | Admitting: Neurological Surgery

## 2021-07-11 DIAGNOSIS — M48061 Spinal stenosis, lumbar region without neurogenic claudication: Secondary | ICD-10-CM

## 2021-07-12 ENCOUNTER — Telehealth: Payer: Self-pay

## 2021-07-12 MED ORDER — PREDNISONE 50 MG PO TABS: ORAL_TABLET | ORAL | 0 refills | Status: DC

## 2021-07-12 NOTE — Telephone Encounter (Signed)
Phone call to patient to review instructions for 13 hr prep for NRB w/ contrast on 07/18/21 at 9:00AM.  Prescription called into [facility]. Pt aware and verbalized understanding of instructions.   Prescription:  50 mg of Prednisone on 07/17/21 at 8:00PM 50 mg of Prednisone on 07/18/21 at 2:00AM 50 mg of Prednisone on 07/18/21 at 8:00AM. Pt is also to take 50 mg of benadryl on 07/18/21 at 8:00AM.  Patient will take home supply of Bendaryl, pt aware to have a driver the day of procedure.   Please call [phone] with any questions

## 2021-07-18 ENCOUNTER — Ambulatory Visit
Admission: RE | Admit: 2021-07-18 | Discharge: 2021-07-18 | Disposition: A | Discharge status: Home / Self Care | Payer: Managed Care, Other (non HMO) | Source: Ambulatory Visit | Attending: Neurological Surgery | Admitting: Neurological Surgery

## 2021-07-18 DIAGNOSIS — M48061 Spinal stenosis, lumbar region without neurogenic claudication: Secondary | ICD-10-CM

## 2021-07-18 MED ORDER — METHYLPREDNISOLONE ACETATE 40 MG/ML INJ SUSP (RADIOLOG
80.0000 mg | Freq: Once | INTRAMUSCULAR | Status: AC
  Administered 2021-07-18: 80 mg via EPIDURAL

## 2021-07-18 MED ORDER — IOPAMIDOL (ISOVUE-M 200) INJECTION 41%
1.0000 mL | Freq: Once | INTRAMUSCULAR | Status: AC
  Administered 2021-07-18: 1 mL via EPIDURAL

## 2021-07-18 NOTE — Discharge Instructions (Signed)

## 2021-07-20 ENCOUNTER — Other Ambulatory Visit: Payer: Managed Care, Other (non HMO)

## 2022-03-29 ENCOUNTER — Other Ambulatory Visit: Payer: Self-pay | Admitting: Family Medicine

## 2022-03-29 DIAGNOSIS — Z1231 Encounter for screening mammogram for malignant neoplasm of breast: Secondary | ICD-10-CM

## 2022-05-16 ENCOUNTER — Ambulatory Visit
Admission: RE | Admit: 2022-05-16 | Discharge: 2022-05-16 | Disposition: A | Discharge status: Home / Self Care | Payer: Managed Care, Other (non HMO) | Source: Ambulatory Visit | Attending: Family Medicine | Admitting: Family Medicine

## 2022-05-16 DIAGNOSIS — Z1231 Encounter for screening mammogram for malignant neoplasm of breast: Secondary | ICD-10-CM

## 2022-06-18 ENCOUNTER — Other Ambulatory Visit: Payer: Self-pay | Admitting: Neurological Surgery

## 2022-06-18 ENCOUNTER — Telehealth: Payer: Self-pay | Admitting: Gastroenterology

## 2022-06-18 DIAGNOSIS — M533 Sacrococcygeal disorders, not elsewhere classified: Secondary | ICD-10-CM

## 2022-06-18 NOTE — Telephone Encounter (Signed)
Called patient to advise we have records will need the reason for request to send it to the supervising provider for review. Left voicemail.

## 2022-06-19 ENCOUNTER — Telehealth: Payer: Self-pay | Admitting: Gastroenterology

## 2022-06-19 ENCOUNTER — Encounter: Payer: Self-pay | Admitting: Gastroenterology

## 2022-06-19 NOTE — Telephone Encounter (Signed)
Hi Dr. Adela Lank,     This patient is requesting to transfer her care specifically over to you. She previously has history with Dr. Loreta Ave. She is wishing to transfer her are over to you due to feeling as though her symptoms have not been help and was unhappy with the care that was provided. Also would like to transfer her care due to her primary care provider referring her to you as well. Her records are in [facility] media for you to review and advise on scheduling.    Thank you.

## 2022-06-19 NOTE — Telephone Encounter (Signed)
I am happy to see her in the office for new patient visit.  Thanks

## 2022-07-02 ENCOUNTER — Other Ambulatory Visit: Payer: Managed Care, Other (non HMO)

## 2022-07-02 ENCOUNTER — Inpatient Hospital Stay: Admission: RE | Admit: 2022-07-02 | Payer: Managed Care, Other (non HMO) | Source: Ambulatory Visit

## 2022-09-04 ENCOUNTER — Inpatient Hospital Stay: Admission: RE | Admit: 2022-09-04 | Payer: Managed Care, Other (non HMO) | Source: Ambulatory Visit

## 2022-09-04 ENCOUNTER — Other Ambulatory Visit: Payer: Managed Care, Other (non HMO)

## 2022-09-23 ENCOUNTER — Ambulatory Visit
Admission: RE | Admit: 2022-09-23 | Discharge: 2022-09-23 | Disposition: A | Discharge status: Home / Self Care | Payer: Managed Care, Other (non HMO) | Source: Ambulatory Visit | Attending: Neurological Surgery | Admitting: Neurological Surgery

## 2022-09-23 DIAGNOSIS — M533 Sacrococcygeal disorders, not elsewhere classified: Secondary | ICD-10-CM

## 2022-10-04 ENCOUNTER — Encounter: Payer: Self-pay | Admitting: Gastroenterology

## 2022-10-04 ENCOUNTER — Ambulatory Visit (INDEPENDENT_AMBULATORY_CARE_PROVIDER_SITE_OTHER): Payer: Managed Care, Other (non HMO) | Admitting: Gastroenterology

## 2022-10-04 VITALS — BP 144/82 | HR 87 | Ht 66.0 in | Wt 203.0 lb

## 2022-10-04 DIAGNOSIS — R1010 Upper abdominal pain, unspecified: Secondary | ICD-10-CM | POA: Diagnosis not present

## 2022-10-04 DIAGNOSIS — R932 Abnormal findings on diagnostic imaging of liver and biliary tract: Secondary | ICD-10-CM

## 2022-10-04 DIAGNOSIS — R1310 Dysphagia, unspecified: Secondary | ICD-10-CM | POA: Diagnosis not present

## 2022-10-04 DIAGNOSIS — Z86010 Personal history of colonic polyps: Secondary | ICD-10-CM

## 2022-10-04 DIAGNOSIS — K760 Fatty (change of) liver, not elsewhere classified: Secondary | ICD-10-CM | POA: Diagnosis not present

## 2022-10-04 DIAGNOSIS — F1190 Opioid use, unspecified, uncomplicated: Secondary | ICD-10-CM

## 2022-10-04 MED ORDER — OMEPRAZOLE 20 MG PO CPDR: 20.0000 mg | DELAYED_RELEASE_CAPSULE | Freq: Every day | ORAL | 0 refills | Status: DC

## 2022-10-04 NOTE — Progress Notes (Signed)
HPI :  67 year old female with a history of colon polyps, GERD, cholecystectomy, chronic abdominal pain, abnormal liver imaging, here to establish care for some of these issues.  This is my first time seeing her.  PCP is [facility].  She previously received her GI care by Dr. Loreta Ave for the past few years but wanted another opinion on some of her symptoms that have been going on for some time.  She endorses some swallowing difficulty that is been ongoing for about 18 months or so.  She can have an sensation of both solids and liquids getting stuck in her esophagus and then slowly moving through.  She sometimes need to drink water to help push things through.  She tends to eat fast and thinks this can make her symptoms worse.  About 6 weeks ago she had an episode where things got stuck for a period of time that was very uncomfortable, it eventually passed.  She has a history of reflux symptoms for which she has been taking Pepcid occasionally.  She does not have routine reflux symptoms they bother her intermittently.  She denies any nausea or vomiting.  She does have some early satiety as well which has been going on.  One of the main symptoms that bothers her is a discomfort in her left to mid upper abdomen.  She states this is been going on for at least a few years since 2020 or so.  Pain mostly localizes to the upper mid left abdomen, can sometimes radiate to the right upper side.  She states it feels sore after eating a meal, almost always this is when she feels it but it can happen at any time.  It usually lasts for a few hours and goes away.  She typically feels this immediately after she eats something.  She denies any routine use of NSAIDs.  She has had prior EGD to evaluate this as well as imaging of her abdomen as outlined below.  She had an MRI of her abdomen showing a dilated common bile duct in the setting of cholecystectomy with normal LFTs, she also had an indeterminate liver lesion for  which a follow-up MRI was recommended in 6 months but not done.  Liver enzymes have been normal on review of labs in care everywhere.  She is on chronic Norco 2 times daily for chronic back pain.  She has had numerous injections of her SI joint and has plans for potential surgery on her back at some point in time.  She is trying to wean down the amount of Norco she is taking for this.  She does have some constipation at times, uses Colace and occasionally MiraLAX.  She has remote history of C. difficile but not recently.  She has had a few EGDs and colonoscopies over the years, some records were provided.  Most recently she had an EGD and colonoscopy in July 2020 with Dr. Loreta Ave.  She had 2 small adenomas in her colon that were removed, EGD showed some small gastric polyps and prominent gastric folds, biopsies were negative for H. pylori or intestinal metaplasia.  She has a history of cholecystectomy.  I see imaging with an MRI of her abdomen in March 2022.  This showed a dilated bile duct without any obstructive pathology, in the setting of cholecystectomy.  She was also found to have some mild hepatic steatosis.  There was also a subtle indeterminant liver lesion that was noted, radiology had recommended a follow-up MRI  in 6 months to confirm, suspected hemangioma.  She states she never had that done.  She denies any history of liver disease.  Her liver enzymes have historically been normal.  Her platelets are normal.  She does not drink any alcohol.  She does endorse some weight gain in recent years.  Of note she states she had some discomfort with 1 prior colonoscopy and she has had pediatric colonoscope to use to complete her exams in the past which she tolerated better.   Colonsoscopy 07/2018 - Dr. Loreta Ave - 2 small ascending colon polyps, diverticulosis, hemorrhoids - path shows adenomas  EGD 07/2018 - Dr. Loreta Ave - small gastric polyps, prominent gastric folds, biopsies taken - benign path, negative for  HP or GIM  Colonoscopy 06/2013 - one small polyp EGD 06/2013 - small hiatal hernia, otherwise normal  Colonoscopy 01/2010 - adenoma removed EGD 01/2010 - gastritis  RUQ [place] 08/10/2018: IMPRESSION: Gallbladder absent.  Study otherwise unremarkable.  CT abdomen / pelvis with contrast 08/27/18: IMPRESSION: No acute findings or other significant abnormality.   Aortic Atherosclerosis (ICD10-I70.0).  MRI abdomen 03/18/20: IMPRESSION: 1. No acute findings in the abdomen. 2. Persistent post cholecystectomy biliary duct dilation may be post cholecystectomy baseline. Correlation with liver function may be useful. No filling defects are noted. MRCP sequences not performed. 3. Mild hepatic steatosis. 4. Pancreatic atrophy. 5. Subtle lesion in the medial segment of the LEFT hepatic lobe is likely a benign shunt lesion or hemangioma but lacks classic characteristics in that its intensity on later images is not the same is adjacent blood pool. Consider six-month to document stability.   CT scan 07/2001 - epiploic appendagitis   Past Medical History:  Diagnosis Date   Allergic rhinitis    Arthritis    Hiatal hernia    Hyperlipidemia    Hypertension    Hypothyroidism due to Hashimoto's thyroiditis    Macular degeneration disease    Osteoarthritis      Past Surgical History:  Procedure Laterality Date   APPENDECTOMY     BACK SURGERY     BIOPSY THYROID     BREAST EXCISIONAL BIOPSY Left    CATARACT EXTRACTION     CESAREAN SECTION     CHOLECYSTECTOMY     COLONOSCOPY     with EGD Dr Loreta Ave 2020   IR EMBO VENOUS NOT HEMORR HEMANG  INC GUIDE ROADMAPPING  06/10/2017   IR EMBO VENOUS NOT HEMORR HEMANG  INC GUIDE ROADMAPPING  07/22/2017   IR EMBO VENOUS NOT HEMORR HEMANG  INC GUIDE ROADMAPPING  02/24/2018   LAMINECTOMY     [id number]   OTHER SURGICAL HISTORY Bilateral    GVS lasrer   TONSILLECTOMY     VAGINAL HYSTERECTOMY     YAG LASER APPLICATION     Family History  Problem Relation Age  of Onset   Colon polyps Mother    Dementia Mother    Heart disease Mother    Colon polyps Father    Diabetes Father    Emphysema Father    Asthma Sister    Breast cancer Maternal Aunt    Breast cancer Paternal Aunt    Breast cancer Paternal Aunt    Breast cancer Niece        73s   Colon cancer Neg Hx    Stomach cancer Neg Hx    Esophageal cancer Neg Hx    Pancreatic cancer Neg Hx    Social History   Tobacco Use   Smoking status: Former  Current packs/day: 0.00    Average packs/day: 1 pack/day for 7.0 years (7.0 ttl pk-yrs)    Types: Cigarettes    Start date: 04/13/1972    Quit date: 04/14/1979    Years since quitting: 43.5   Smokeless tobacco: Never  Vaping Use   Vaping status: Never Used  Substance Use Topics   Alcohol use: No    Alcohol/week: 0.0 standard drinks of alcohol   Current Outpatient Medications  Medication Sig Dispense Refill   Evolocumab (REPATHA SURECLICK) 140 MG/ML SOAJ Inject 140 mg into the skin every 14 (fourteen) days.     albuterol (VENTOLIN HFA) 108 (90 Base) MCG/ACT inhaler Inhale 1 puff into the lungs every 6 (six) hours as needed for wheezing or shortness of breath. 18 g 2   Ascorbic Acid (VITAMIN C) 1000 MG tablet Take 1,000 mg by mouth daily.     atorvastatin (LIPITOR) 10 MG tablet Take 10 mg by mouth 3 (three) times a week. Mon / Wed / Fri     calcium carbonate (TUMS - DOSED IN MG ELEMENTAL CALCIUM) 500 MG chewable tablet Chew 1 tablet by mouth daily as needed for indigestion or heartburn.     Calcium-Vitamin D (CALTRATE 600 PLUS-VIT D PO) Take 1 tablet by mouth daily.     cephALEXin (KEFLEX) 250 MG capsule Take 250 mg by mouth daily as needed (after intercourse).     conjugated estrogens (PREMARIN) vaginal cream Place 1 Applicatorful vaginally 3 (three) times a week. Mon / Wed / Fri     Cranberry 1000 MG CAPS Take 1,000 mg by mouth 2 (two) times daily.     cycloSPORINE (RESTASIS) 0.05 % ophthalmic emulsion Place 1 drop into both eyes 2 (two)  times daily.     diazepam (VALIUM) 10 MG tablet Take 10 mg by mouth every 6 (six) hours as needed for anxiety.     diclofenac (VOLTAREN) 0.1 % ophthalmic solution 4 (four) times daily.     docusate sodium (COLACE) 100 MG capsule Take 100 mg by mouth daily as needed for mild constipation.     erythromycin with ethanol (EMGEL) 2 % gel Apply topically daily.     esomeprazole (NEXIUM) 40 MG capsule Take 40 mg by mouth daily at 12 noon.     estradiol (VIVELLE-DOT) 0.025 MG/24HR Place 1 patch onto the skin 2 (two) times a week. Sat / Wed     HYDROcodone-acetaminophen (NORCO) 10-325 MG per tablet Take 1 tablet by mouth every 6 (six) hours as needed for moderate pain.      latanoprost (XALATAN) 0.005 % ophthalmic solution 1 drop at bedtime.     levothyroxine (SYNTHROID, LEVOTHROID) 88 MCG tablet Take 112 mcg by mouth daily before breakfast.      minocycline (MINOCIN) 50 MG capsule Take 50 mg by mouth 2 (two) times daily.     Misc Natural Products (OSTEO BI-FLEX ADV DOUBLE ST PO) Take 1 tablet by mouth 2 (two) times daily.      montelukast (SINGULAIR) 10 MG tablet Take 1 tablet (10 mg total) by mouth at bedtime. 30 tablet 11   Multiple Vitamins-Minerals (EYE VITAMINS) CAPS Take 1 capsule by mouth 2 (two) times daily.     neomycin-polymyxin-dexamethasone (MAXITROL) 0.1 % ophthalmic suspension neomycin-polymyxin-dexameth 3.5 mg/mL-10,000 unit/mL-0.1% eye drops     Olopatadine HCl (PAZEO) 0.7 % SOLN Pazeo 0.7 % eye drops     polyethylene glycol (MIRALAX / GLYCOLAX) 17 g packet Take 17 g by mouth daily.     predniSONE (  DELTASONE) 50 MG tablet Pt to take 50 mg of prednisone on 07/17/21 at 8:00PM, 50 mg of prednisone on 07/18/21 at 2:00AM, and 50 mg of prednisone on 07/18/21 at 8:00AM. Pt is also to take 50 mg of benadryl on 07/18/21 at 8:00AM. Please call [phone] with any questions. 3 tablet 0   saccharomyces boulardii (FLORASTOR) 250 MG capsule Take 250 mg by mouth daily.     Turmeric (QC TUMERIC COMPLEX) 500  MG CAPS Take by mouth.     valsartan (DIOVAN) 80 MG tablet Take 40 mg by mouth daily.      No current facility-administered medications for this visit.   Allergies  Allergen Reactions   Contrast Media [Iodinated Contrast Media] Hives    After administration of CT contrast     Cymbalta [Duloxetine Hcl] Other (See Comments)    Heart racing / Increased blood pressure   Erythromycin Diarrhea and Other (See Comments)    Diarrhea and severe heartburn   Gabapentin Other (See Comments)    Vertigo; anxiety    Leflunomide Other (See Comments)    Flu like symptoms    Relafen [Nabumetone] Other (See Comments)    GI issues   Rosuvastatin Other (See Comments)    Elevated liver enzymes   Gadolinium Derivatives Hives    One hive with minimal itching after gadolinium  injection   Meloxicam Nausea Only   Morphine And Codeine Palpitations   Nitrofurantoin Macrocrystal Diarrhea   Tapentadol Hcl Nausea Only and Anxiety     Review of Systems: All systems reviewed and negative except where noted in HPI.   Labs reviewed in care everywhere   Physical Exam: BP (!) 144/82   Pulse 87   Ht 5\' 6"  (1.676 m)   Wt 203 lb (92.1 kg)   SpO2 99%   BMI 32.77 kg/m  Constitutional: Pleasant,well-developed, female in no acute distress. HEENT: Normocephalic and atraumatic. Conjunctivae are normal. No scleral icterus. Neck supple.  Cardiovascular: Normal rate, regular rhythm.  Pulmonary/chest: Effort normal and breath sounds normal. No wheezing, rales or rhonchi. Abdominal: Soft, nondistended, nontender. Could not reproduce pain with palpation.. There are no masses palpable. No hepatomegaly. Extremities: no edema Neurological: Alert and oriented to person place and time. Skin: Skin is warm and dry. No rashes noted. Psychiatric: Normal mood and affect. Behavior is normal.   ASSESSMENT: 67 y.o. female here for assessment of the following  1. Dysphagia, unspecified type   2. Pain of upper abdomen    3. Abnormal liver diagnostic imaging   4. Metabolic dysfunction-associated steatotic liver disease (MASLD)   5. History of colon polyps   6. Chronic narcotic use    Multiple issues addressed today for her first patient visit as outlined:  Intermittent dysphagia for several months.  She has had an EGD before without any Barrett's or high risk lesions however dysphagia is new.  In light of her upper abdominal postprandial pain and her dysphagia, EGD was offered to further evaluate and treat with dilation.  I discussed risks and benefits of endoscopy and anesthesia with her and she would like to proceed.  If the exam is negative we will plan on empiric dilation.  We discussed that if the endoscopy is negative, dilation does not help, she may have dysmotility from underlying narcotic use and can further evaluate that if needed.  She is working on reducing her narcotic use.  Unclear what is causing her abdominal pain.  Postprandial nature of pain with some early satiety, PUD on the  list however her prior endoscopy was negative for that.  She is also at risk for gastroparesis with her narcotic use, potentially that could be causing some of her early satiety and postprandial pain.  EGD will evaluate for some of these issues, also recommending follow-up MRI to evaluate liver lesion as previously recommended a few years ago.  We will start with these 2 test, and start her empirically on a trial of omeprazole 20 mg daily for 30 days or so.  If negative workup and symptoms persist, musculoskeletal pain also remains possible however postprandial nature of that is a bit atypical.  As above will evaluate dilated common bile duct and indeterminate liver lesion with an MRCP.  I suspect her bile duct dilation could simply reflect her postcholecystectomy state but in light of her symptoms we will make sure okay and further evaluate the liver lesion which hopefully is just a benign hemangioma.  Fatty liver noted on  prior imaging.  We discussed what this is, risks for fibrosis and cirrhosis over time.  Her liver enzymes and platelet counts are normal and that is reassuring.  She has had some weight gain recently and we discussed importance of weight loss.  She does not drink any alcohol.  Encouraged routine coffee intake as this can help reduce her risk for fibrosis.  We will need to keep an eye on this over time.  Interval imaging with MRI will also reassess this.  Recommend LFTs yearly.  Otherwise she is not due for colonoscopy at this time due at earliest in 2025 if not 2027 based on updated guidelines.  She understands and agrees.   PLAN: - [facility]GD at [facility] - will plan on empiric dilation even if normal given frequency of symptoms. Dysmotility also possible related to narcotics, over time would recommend reducing / stopping narcotics - start omeprazole 20mg  / day for 30 day trial - minimize NSAID use - MRCP - assess biliary ductal dilation, rule out hemangioma, rule out other causes for pain - counseled on fatty liver-  LFTs and platelets normal, work on weight loss, encouraged coffee intake, check LFTs yearly. Consideration for elastography over time.  - colonoscopy not due until 2025 to 2027  I spent over 1 hour in total reviewing the the patient's chart and prior workup, total face time with the patient, and time spent documenting this encounter.  Harlin Rain, MD [facility]  CC: Ladora Daniel, PA-C

## 2022-10-04 NOTE — Patient Instructions (Addendum)
You have been scheduled for an endoscopy. Please follow written instructions given to you at your visit today.  If you use inhalers (even only as needed), please bring them with you on the day of your procedure.  If you take any of the following medications, they will need to be adjusted prior to your procedure:   DO NOT TAKE 7 DAYS PRIOR TO TEST- Trulicity (dulaglutide) Ozempic, Wegovy (semaglutide) Mounjaro (tirzepatide) Bydureon Bcise (exanatide extended release)  DO NOT TAKE 1 DAY PRIOR TO YOUR TEST Rybelsus (semaglutide) Adlyxin (lixisenatide) Victoza (liraglutide) Byetta (exanatide) ___________________________________________________________________________  We have sent the following medications to your pharmacy for you to pick up at your convenience: Omeprazole 20 mg: Take once daily  Please minimize NSAID use.  You will be contacted by [facility] Scheduling in the next 2 days to arrange a MRI/MRCP.  The number on your caller ID will be [phone], please answer when they call.  If you have not heard from them in 2 days please call [phone] to schedule.     Thank you for entrusting me with your care and for choosing [facility], Dr. Ileene Patrick     If your blood pressure at your visit was 140/90 or greater, please contact your primary care physician to follow up on this. ______________________________________________________  If you are age 65 or older, your body mass index should be between 23-30. Your Body mass index is 32.77 kg/m. If this is out of the aforementioned range listed, please consider follow up with your Primary Care Provider.  If you are age 56 or younger, your body mass index should be between 19-25. Your Body mass index is 32.77 kg/m. If this is out of the aformentioned range listed, please consider follow up with your Primary Care Provider.  ________________________________________________________  The [facility]  providers would like to encourage you to use [facility] to communicate with providers for non-urgent requests or questions.  Due to long hold times on the telephone, sending your provider a message by [facility] may be a faster and more efficient way to get a response.  Please allow 48 business hours for a response.  Please remember that this is for non-urgent requests.  _______________________________________________________  Due to recent changes in healthcare laws, you may see the results of your imaging and laboratory studies on MyChart before your provider has had a chance to review them.  We understand that in some cases there may be results that are confusing or concerning to you. Not all laboratory results come back in the same time frame and the provider may be waiting for multiple results in order to interpret others.  Please give [place] 48 hours in order for your provider to thoroughly review all the results before contacting the office for clarification of your results.

## 2022-10-24 ENCOUNTER — Ambulatory Visit (HOSPITAL_BASED_OUTPATIENT_CLINIC_OR_DEPARTMENT_OTHER): Payer: Managed Care, Other (non HMO)

## 2022-10-24 ENCOUNTER — Inpatient Hospital Stay: Admission: RE | Admit: 2022-10-24 | Payer: Managed Care, Other (non HMO) | Source: Ambulatory Visit

## 2022-11-01 ENCOUNTER — Other Ambulatory Visit: Payer: Self-pay | Admitting: Neurological Surgery

## 2022-11-05 ENCOUNTER — Other Ambulatory Visit: Payer: Self-pay

## 2022-11-05 MED ORDER — OMEPRAZOLE 20 MG PO CPDR: 20.0000 mg | DELAYED_RELEASE_CAPSULE | Freq: Every day | ORAL | 5 refills | Status: DC

## 2022-11-05 NOTE — Progress Notes (Signed)
Refill of omeprazole sent to pharmacy - [facility]

## 2022-11-06 ENCOUNTER — Other Ambulatory Visit: Payer: Self-pay | Admitting: Neurological Surgery

## 2022-11-08 ENCOUNTER — Encounter: Payer: Managed Care, Other (non HMO) | Admitting: Gastroenterology

## 2022-11-09 ENCOUNTER — Ambulatory Visit
Admission: RE | Admit: 2022-11-09 | Discharge: 2022-11-09 | Disposition: A | Discharge status: Home / Self Care | Payer: Managed Care, Other (non HMO) | Source: Ambulatory Visit | Attending: Gastroenterology | Admitting: Gastroenterology

## 2022-11-09 DIAGNOSIS — R1010 Upper abdominal pain, unspecified: Secondary | ICD-10-CM

## 2022-11-09 DIAGNOSIS — R1310 Dysphagia, unspecified: Secondary | ICD-10-CM

## 2022-11-11 ENCOUNTER — Encounter: Payer: Self-pay | Admitting: Gastroenterology

## 2022-11-11 ENCOUNTER — Encounter: Payer: Managed Care, Other (non HMO) | Admitting: Gastroenterology

## 2022-11-11 ENCOUNTER — Ambulatory Visit (AMBULATORY_SURGERY_CENTER): Payer: Managed Care, Other (non HMO) | Admitting: Gastroenterology

## 2022-11-11 VITALS — BP 140/68 | HR 68 | Temp 98.0°F | Resp 11 | Ht 66.0 in | Wt 203.0 lb

## 2022-11-11 DIAGNOSIS — K319 Disease of stomach and duodenum, unspecified: Secondary | ICD-10-CM

## 2022-11-11 DIAGNOSIS — R1310 Dysphagia, unspecified: Secondary | ICD-10-CM | POA: Diagnosis not present

## 2022-11-11 DIAGNOSIS — K317 Polyp of stomach and duodenum: Secondary | ICD-10-CM | POA: Diagnosis present

## 2022-11-11 DIAGNOSIS — R1010 Upper abdominal pain, unspecified: Secondary | ICD-10-CM

## 2022-11-11 MED ORDER — SODIUM CHLORIDE 0.9 % IV SOLN: 500.0000 mL | Freq: Once | INTRAVENOUS | Status: DC

## 2022-11-11 NOTE — Op Note (Signed)
[facility] Patient Name: Diana Mcpherson Procedure Date: 11/11/2022 1:18 PM MRN: [id number] Endoscopist: Viviann Spare P. Adela Lank , MD, [id number] Age: 67 Referring MD:  Date of Birth: 1955-09-21 Gender: Female Account #: [id number] Procedure:                Upper GI endoscopy Indications:              Dysphagia, also with upper abdominal pain. On                            omeprazole since office visit which has helped her                            symptoms. MRCP just recently done and formal report                            pending Medicines:                Monitored Anesthesia Care Procedure:                Pre-Anesthesia Assessment:                           - Prior to the procedure, a History and Physical                            was performed, and patient medications and                            allergies were reviewed. The patient's tolerance of                            previous anesthesia was also reviewed. The risks                            and benefits of the procedure and the sedation                            options and risks were discussed with the patient.                            All questions were answered, and informed consent                            was obtained. Prior Anticoagulants: The patient has                            taken no anticoagulant or antiplatelet agents. ASA                            Grade Assessment: II - A patient with mild systemic                            disease. After reviewing the risks and benefits,  the patient was deemed in satisfactory condition to                            undergo the procedure.                           After obtaining informed consent, the endoscope was                            passed under direct vision. Throughout the                            procedure, the patient's blood pressure, pulse, and                            oxygen saturations were monitored  continuously. The                            Olympus Scope SN [id number] was introduced through the                            mouth, and advanced to the second part of duodenum.                            The upper GI endoscopy was accomplished without                            difficulty. The patient tolerated the procedure                            well. Scope In: Scope Out: Findings:                 Esophagogastric landmarks were identified: the                            Z-line was found at 38 cm, the gastroesophageal                            junction was found at 38 cm and the upper extent of                            the gastric folds was found at 39 cm from the                            incisors.                           A 1 cm hiatal hernia was present.                           There was a benign gastric inlet patch in the                            proximal esophagus. The  exam of the esophagus was                            otherwise normal. No focal stenosis / stricture                            appreciated.                           A guidewire was placed and the scope was withdrawn.                            Empiric dilation was performed in the entire                            esophagus with a Savary dilator with mild                            resistance at 17 mm. Relook endoscopy showed no                            mucosal wrents.                           Patchy mildly erythematous mucosa was found in the                            gastric fundus and in the gastric body. Biopsies                            were taken with a cold forceps for Helicobacter                            pylori testing.                           A few small sessile polyps were found in the                            gastric fundus and in the gastric body - suspect                            benign fundic gland polyps. A few representative                            polyps were removed  with a cold biopsy forceps.                            Resection and retrieval were complete.                           The exam of the stomach was otherwise normal.  The examined duodenum was normal. Ampulla only                            partially seen with forward viewing scope. Complications:            No immediate complications. Estimated blood loss:                            Minimal. Estimated Blood Loss:     Estimated blood loss was minimal. Impression:               - Esophagogastric landmarks identified.                           - 1 cm hiatal hernia.                           - Benign gastric inlet patch of the proximal                            esophagus.                           - Normal esophagus otherwise - empirically dilated                            to 17mm as outlined.                           - Erythematous mucosa in the gastric fundus and                            gastric body. Biopsied.                           - A few gastric polyps, benign appearing. A few                            representative samples resected and retrieved.                           - Normal examined duodenum. Recommendation:           - Patient has a contact number available for                            emergencies. The signs and symptoms of potential                            delayed complications were discussed with the                            patient. Return to normal activities tomorrow.                            Written discharge instructions were provided to the  patient.                           - Resume previous diet.                           - Continue present medications.                           - Await pathology results.                           - Continue omeprazole for now if that is helping                            symptoms                           - Await MRCP results (history of dilated dile duct                             in the setting of cholecystectomy, ensure no other                            cause for this and her pain) Viviann Spare P. Nada Godley, MD 11/11/2022 1:42:03 PM This report has been signed electronically.

## 2022-11-11 NOTE — Progress Notes (Signed)
[facility] History and Physical   Primary Care Physician:  Ladora Daniel, PA-C   Reason for Procedure:   EGD with dilation  Plan:    Dysphagia, abdominal pain     HPI: Diana Mcpherson is a 67 y.o. female  here for EGD to evaluate dysphagia and abdominal pain. Since office visit her abdominal discomfort has improved somewhat on omeprazole, she does feel it is helping. Continues to have dysphagia.   Otherwise feels well without any cardiopulmonary symptoms today.   I have discussed risks / benefits of anesthesia and endoscopic procedure with Suzy Bouchard and they wish to proceed with the exams as outlined today.    Past Medical History:  Diagnosis Date   Allergic rhinitis    Arthritis    Hiatal hernia    Hyperlipidemia    Hypertension    Hypothyroidism due to Hashimoto's thyroiditis    Macular degeneration disease    Osteoarthritis     Past Surgical History:  Procedure Laterality Date   APPENDECTOMY     BACK SURGERY     BIOPSY THYROID     BREAST EXCISIONAL BIOPSY Left    CATARACT EXTRACTION     CESAREAN SECTION     CHOLECYSTECTOMY     COLONOSCOPY     with EGD Dr [address] 2020   IR EMBO VENOUS NOT HEMORR HEMANG  INC GUIDE ROADMAPPING  06/10/2017   IR EMBO VENOUS NOT HEMORR HEMANG  INC GUIDE ROADMAPPING  07/22/2017   IR EMBO VENOUS NOT HEMORR HEMANG  INC GUIDE ROADMAPPING  02/24/2018   LAMINECTOMY     [id number]   OTHER SURGICAL HISTORY Bilateral    GVS lasrer   TONSILLECTOMY     VAGINAL HYSTERECTOMY     YAG LASER APPLICATION      Prior to Admission medications   Medication Sig Start Date End Date Taking? Authorizing Provider  Ascorbic Acid (VITAMIN C) 1000 MG tablet Take 1,000 mg by mouth daily.   Yes [provider]  Calcium-Vitamin D (CALTRATE 600 PLUS-VIT D PO) Take 1 tablet by mouth daily.   Yes [provider]  conjugated estrogens (PREMARIN) vaginal cream Place 1 Applicatorful vaginally 3 (three) times a week. Mon / Wed / Fri   Yes  [provider]  cycloSPORINE (RESTASIS) 0.05 % ophthalmic emulsion Place 1 drop into both eyes 2 (two) times daily.   Yes [provider]  diazepam (VALIUM) 10 MG tablet Take 10 mg by mouth every 6 (six) hours as needed for anxiety.   Yes [provider]  docusate sodium (COLACE) 100 MG capsule Take 100 mg by mouth daily as needed for mild constipation.   Yes [provider]  HYDROcodone-acetaminophen (NORCO) 10-325 MG per tablet Take 1 tablet by mouth every 6 (six) hours as needed for moderate pain.    Yes [provider]  latanoprost (XALATAN) 0.005 % ophthalmic solution 1 drop at bedtime.   Yes [provider]  minocycline (MINOCIN) 50 MG capsule Take 50 mg by mouth 2 (two) times daily.   Yes [provider]  Multiple Vitamins-Minerals (EYE VITAMINS) CAPS Take 1 capsule by mouth 2 (two) times daily.   Yes [provider]  omeprazole (PRILOSEC) 20 MG capsule Take 1 capsule (20 mg total) by mouth daily. 11/05/22  Yes Nabil Bubolz, Willaim Rayas, MD  valsartan (DIOVAN) 80 MG tablet Take 40 mg by mouth daily.    Yes [provider]  albuterol (VENTOLIN HFA) 108 (90 Base) MCG/ACT inhaler Inhale  1 puff into the lungs every 6 (six) hours as needed for wheezing or shortness of breath. 02/23/19   Charlott Holler, MD  atorvastatin (LIPITOR) 10 MG tablet Take 10 mg by mouth 3 (three) times a week. Mon / Wed / Fri    [provider]  calcium carbonate (TUMS - DOSED IN MG ELEMENTAL CALCIUM) 500 MG chewable tablet Chew 1 tablet by mouth daily as needed for indigestion or heartburn.    [provider]  cephALEXin (KEFLEX) 250 MG capsule Take 250 mg by mouth daily as needed (after intercourse).    [provider]  Cranberry 1000 MG CAPS Take 1,000 mg by mouth 2 (two) times daily.    [provider]  diclofenac (VOLTAREN) 0.1 % ophthalmic solution 4 (four) times daily.    [provider]   erythromycin with ethanol (EMGEL) 2 % gel Apply topically daily.    [provider]  estradiol (VIVELLE-DOT) 0.025 MG/24HR Place 1 patch onto the skin 2 (two) times a week. Sat / Wed    [provider]  Evolocumab (REPATHA SURECLICK) 140 MG/ML SOAJ Inject 140 mg into the skin every 14 (fourteen) days.    [provider]  levothyroxine (SYNTHROID, LEVOTHROID) 88 MCG tablet Take 112 mcg by mouth daily before breakfast.     [provider]  Misc Natural Products (OSTEO BI-FLEX ADV DOUBLE ST PO) Take 1 tablet by mouth 2 (two) times daily.     [provider]  montelukast (SINGULAIR) 10 MG tablet Take 1 tablet (10 mg total) by mouth at bedtime. Patient not taking: Reported on 11/11/2022 05/17/19   Charlott Holler, MD  neomycin-polymyxin-dexamethasone (MAXITROL) 0.1 % ophthalmic suspension neomycin-polymyxin-dexameth 3.5 mg/mL-10,000 unit/mL-0.1% eye drops    [provider]  Olopatadine HCl (PAZEO) 0.7 % SOLN Pazeo 0.7 % eye drops    [provider]  polyethylene glycol (MIRALAX / GLYCOLAX) 17 g packet Take 17 g by mouth daily.    [provider]  predniSONE (DELTASONE) 50 MG tablet Pt to take 50 mg of prednisone on 07/17/21 at 8:00PM, 50 mg of prednisone on 07/18/21 at 2:00AM, and 50 mg of prednisone on 07/18/21 at 8:00AM. Pt is also to take 50 mg of benadryl on 07/18/21 at 8:00AM. Please call [phone] with any questions. 07/12/21   Obie Dredge, MD  saccharomyces boulardii (FLORASTOR) 250 MG capsule Take 250 mg by mouth daily.    [provider]  Turmeric (QC TUMERIC COMPLEX) 500 MG CAPS Take by mouth.    [provider]    Current Outpatient Medications  Medication Sig Dispense Refill   Ascorbic Acid (VITAMIN C) 1000 MG tablet Take 1,000 mg by mouth daily.     Calcium-Vitamin D (CALTRATE 600 PLUS-VIT D PO) Take 1 tablet by mouth daily.     conjugated estrogens (PREMARIN) vaginal cream Place 1 Applicatorful  vaginally 3 (three) times a week. Mon / Wed / Fri     cycloSPORINE (RESTASIS) 0.05 % ophthalmic emulsion Place 1 drop into both eyes 2 (two) times daily.     diazepam (VALIUM) 10 MG tablet Take 10 mg by mouth every 6 (six) hours as needed for anxiety.     docusate sodium (COLACE) 100 MG capsule Take 100 mg by mouth daily as needed for mild constipation.     HYDROcodone-acetaminophen (NORCO) 10-325 MG per tablet Take 1 tablet by mouth every 6 (six) hours as needed for moderate pain.      latanoprost (XALATAN)  0.005 % ophthalmic solution 1 drop at bedtime.     minocycline (MINOCIN) 50 MG capsule Take 50 mg by mouth 2 (two) times daily.     Multiple Vitamins-Minerals (EYE VITAMINS) CAPS Take 1 capsule by mouth 2 (two) times daily.     omeprazole (PRILOSEC) 20 MG capsule Take 1 capsule (20 mg total) by mouth daily. 30 capsule 5   valsartan (DIOVAN) 80 MG tablet Take 40 mg by mouth daily.      albuterol (VENTOLIN HFA) 108 (90 Base) MCG/ACT inhaler Inhale 1 puff into the lungs every 6 (six) hours as needed for wheezing or shortness of breath. 18 g 2   atorvastatin (LIPITOR) 10 MG tablet Take 10 mg by mouth 3 (three) times a week. Mon / Wed / Fri     calcium carbonate (TUMS - DOSED IN MG ELEMENTAL CALCIUM) 500 MG chewable tablet Chew 1 tablet by mouth daily as needed for indigestion or heartburn.     cephALEXin (KEFLEX) 250 MG capsule Take 250 mg by mouth daily as needed (after intercourse).     Cranberry 1000 MG CAPS Take 1,000 mg by mouth 2 (two) times daily.     diclofenac (VOLTAREN) 0.1 % ophthalmic solution 4 (four) times daily.     erythromycin with ethanol (EMGEL) 2 % gel Apply topically daily.     estradiol (VIVELLE-DOT) 0.025 MG/24HR Place 1 patch onto the skin 2 (two) times a week. Sat / Wed     Evolocumab (REPATHA SURECLICK) 140 MG/ML SOAJ Inject 140 mg into the skin every 14 (fourteen) days.     levothyroxine (SYNTHROID, LEVOTHROID) 88 MCG tablet Take 112 mcg by mouth daily before breakfast.       Misc Natural Products (OSTEO BI-FLEX ADV DOUBLE ST PO) Take 1 tablet by mouth 2 (two) times daily.      montelukast (SINGULAIR) 10 MG tablet Take 1 tablet (10 mg total) by mouth at bedtime. (Patient not taking: Reported on 11/11/2022) 30 tablet 11   neomycin-polymyxin-dexamethasone (MAXITROL) 0.1 % ophthalmic suspension neomycin-polymyxin-dexameth 3.5 mg/mL-10,000 unit/mL-0.1% eye drops     Olopatadine HCl (PAZEO) 0.7 % SOLN Pazeo 0.7 % eye drops     polyethylene glycol (MIRALAX / GLYCOLAX) 17 g packet Take 17 g by mouth daily.     predniSONE (DELTASONE) 50 MG tablet Pt to take 50 mg of prednisone on 07/17/21 at 8:00PM, 50 mg of prednisone on 07/18/21 at 2:00AM, and 50 mg of prednisone on 07/18/21 at 8:00AM. Pt is also to take 50 mg of benadryl on 07/18/21 at 8:00AM. Please call [phone] with any questions. 3 tablet 0   saccharomyces boulardii (FLORASTOR) 250 MG capsule Take 250 mg by mouth daily.     Turmeric (QC TUMERIC COMPLEX) 500 MG CAPS Take by mouth.     Current Facility-Administered Medications  Medication Dose Route Frequency Provider Last Rate Last Admin   0.9 %  sodium chloride infusion  500 mL Intravenous Once Annemarie Sebree, Willaim Rayas, MD        Allergies as of 11/11/2022 - Review Complete 11/11/2022  Allergen Reaction Noted   Contrast media [iodinated contrast media] Hives 04/14/2014   Cymbalta [duloxetine hcl] Other (See Comments) 07/04/2014   Erythromycin Diarrhea and Other (See Comments) 08/14/2012   Gabapentin Other (See Comments) 04/24/2017   Leflunomide Other (See Comments) 06/09/2012   Relafen [nabumetone] Other (See Comments) 07/04/2014   Rosuvastatin Other (See Comments) 08/14/2012   Gadolinium derivatives Hives 07/27/2014   Meloxicam Nausea Only 06/09/2012   Morphine and codeine  Palpitations 04/14/2014   Nitrofurantoin macrocrystal Diarrhea 08/14/2012   Tapentadol hcl Nausea Only and Anxiety 12/26/2016    Family History  Problem Relation Age of Onset   Colon  polyps Mother    Dementia Mother    Heart disease Mother    Colon polyps Father    Diabetes Father    Emphysema Father    Asthma Sister    Breast cancer Maternal Aunt    Breast cancer Paternal Aunt    Breast cancer Paternal Aunt    Breast cancer Niece        37s   Colon cancer Neg Hx    Stomach cancer Neg Hx    Esophageal cancer Neg Hx    Pancreatic cancer Neg Hx     Social History   Socioeconomic History   Marital status: Married    Spouse name: Not on file   Number of children: Not on file   Years of education: Not on file   Highest education level: Not on file  Occupational History   Not on file  Tobacco Use   Smoking status: Former    Current packs/day: 0.00    Average packs/day: 1 pack/day for 7.0 years (7.0 ttl pk-yrs)    Types: Cigarettes    Start date: 04/13/1972    Quit date: 04/14/1979    Years since quitting: 43.6   Smokeless tobacco: Never  Vaping Use   Vaping status: Never Used  Substance and Sexual Activity   Alcohol use: No    Alcohol/week: 0.0 standard drinks of alcohol   Drug use: Not on file   Sexual activity: Not on file  Other Topics Concern   Not on file  Social History Narrative   Not on file   Social Determinants of Health   Financial Resource Strain: Low Risk  (05/15/2022)   Received from [facility], [facility]   Overall Financial Resource Strain (CARDIA)    Difficulty of Paying Living Expenses: Not hard at all  Food Insecurity: No Food Insecurity (05/15/2022)   Received from [facility], [facility]   Hunger Vital Sign    Worried About Running Out of Food in the Last Year: Never true    Ran Out of Food in the Last Year: Never true  Transportation Needs: No Transportation Needs (05/15/2022)   Received from [employer], [facility]   PRAPARE - Transportation    Lack of Transportation (Medical): No    Lack of Transportation (Non-Medical): No  Physical Activity: Unknown (05/15/2022)   Received from [facility], [facility]    Exercise Vital Sign    Days of Exercise per Week: Patient declined    Minutes of Exercise per Session: Not on file  Stress: No Stress Concern Present (05/15/2022)   Received from [facility], [facility] - Occupational Stress Questionnaire    Feeling of Stress : Only a little  Social Connections: Somewhat Isolated (05/15/2022)   Received from [facility], [facility]   Social Network    How would you rate your social network (family, work, friends)?: Restricted participation with some degree of social isolation  Intimate Partner Violence: Not At Risk (05/15/2022)   Received from [facility], [facility]   HITS    Over the last 12 months how often did your partner physically hurt you?: 1    Over the last 12 months how often did your partner insult you or talk down to you?: 1  Over the last 12 months how often did your partner threaten you with physical harm?: 1    Over the last 12 months how often did your partner scream or curse at you?: 1    Review of Systems: All other review of systems negative except as mentioned in the HPI.  Physical Exam: Vital signs BP 134/61   Pulse 85   Temp 98 F (36.7 C)   Ht 5\' 6"  (1.676 m)   Wt 203 lb (92.1 kg)   SpO2 98%   BMI 32.77 kg/m   General:   Alert,  Well-developed, pleasant and cooperative in NAD Lungs:  Clear throughout to auscultation.   Heart:  Regular rate and rhythm Abdomen:  Soft, nontender and nondistended.   Neuro/Psych:  Alert and cooperative. Normal mood and affect. A and O x 3  Harlin Rain, MD [facility]

## 2022-11-11 NOTE — Progress Notes (Signed)
Pt's states no medical or surgical changes since previsit or office visit. 

## 2022-11-11 NOTE — Progress Notes (Signed)
A/O x 3, gd SR's, VSS, report to RN

## 2022-11-11 NOTE — Progress Notes (Signed)
Called to room to assist during endoscopic procedure.  Patient ID and intended procedure confirmed with present staff. Received instructions for my participation in the procedure from the performing physician.  

## 2022-11-11 NOTE — Patient Instructions (Signed)
Resume previous diet Continue present medications Await pathology results  Handouts/information given for gastric polyps, hiatal hernia and esophageal dilation  YOU HAD AN ENDOSCOPIC PROCEDURE TODAY AT THE [facility]:   Refer to the procedure report that was given to you for any specific questions about what was found during the examination.  If the procedure report does not answer your questions, please call your gastroenterologist to clarify.  If you requested that your care partner not be given the details of your procedure findings, then the procedure report has been included in a sealed envelope for you to review at your convenience later.  YOU SHOULD EXPECT: Some feelings of bloating in the abdomen. Passage of more gas than usual.  Walking can help get rid of the air that was put into your GI tract during the procedure and reduce the bloating. If you had a lower endoscopy (such as a colonoscopy or flexible sigmoidoscopy) you may notice spotting of blood in your stool or on the toilet paper. If you underwent a bowel prep for your procedure, you may not have a normal bowel movement for a few days.  Please Note:  You might notice some irritation and congestion in your nose or some drainage.  This is from the oxygen used during your procedure.  There is no need for concern and it should clear up in a day or so.  SYMPTOMS TO REPORT IMMEDIATELY:  Following upper endoscopy (EGD)  Vomiting of blood or coffee ground material  New chest pain or pain under the shoulder blades  Painful or persistently difficult swallowing  New shortness of breath  Fever of 100F or higher  Black, tarry-looking stools  For urgent or emergent issues, a gastroenterologist can be reached at any hour by calling [phone]. Do not use MyChart messaging for urgent concerns.   DIET:  We do recommend a small meal at first, but then you may proceed to your regular diet.  Drink plenty of fluids but you  should avoid alcoholic beverages for 24 hours.  ACTIVITY:  You should plan to take it easy for the rest of today and you should NOT DRIVE or use heavy machinery until tomorrow (because of the sedation medicines used during the test).    FOLLOW UP: Our staff will call the number listed on your records the next business day following your procedure.  We will call around 7:15- 8:00 am to check on you and address any questions or concerns that you may have regarding the information given to you following your procedure. If we do not reach you, we will leave a message.     If any biopsies were taken you will be contacted by phone or by letter within the next 1-3 weeks.  Please call us at [phone] if you have not heard about the biopsies in 3 weeks.   SIGNATURES/CONFIDENTIALITY: You and/or your care partner have signed paperwork which will be entered into your electronic medical record.  These signatures attest to the fact that that the information above on your After Visit Summary has been reviewed and is understood.  Full responsibility of the confidentiality of this discharge information lies with you and/or your care-partner.

## 2022-11-12 ENCOUNTER — Telehealth: Payer: Self-pay | Admitting: *Deleted

## 2022-11-12 NOTE — Telephone Encounter (Signed)
  Follow up Call-     11/11/2022    1:06 PM  Call back number  Post procedure Call Back phone  # [phone]  Permission to leave phone message Yes     Patient questions:  Do you have a fever, pain , or abdominal swelling? No. Pain Score  0 *  Have you tolerated food without any problems? Yes.    Have you been able to return to your normal activities? Yes.    Do you have any questions about your discharge instructions: Diet   No. Medications  No. Follow up visit  No.  Do you have questions or concerns about your Care? No.  Actions: * If pain score is 4 or above: No action needed, pain <4.

## 2022-11-14 LAB — SURGICAL PATHOLOGY

## 2022-11-19 NOTE — Pre-Procedure Instructions (Signed)
Surgical Instructions   Your procedure is scheduled on Tuesday, November 19th. Report to [facility] Main Entrance "A" at 10:45 A.M., then check in with the Admitting office. Any questions or running late day of surgery: call [phone]  Questions prior to your surgery date: call [phone], Monday-Friday, 8am-4pm. If you experience any cold or flu symptoms such as cough, fever, chills, shortness of breath, etc. between now and your scheduled surgery, please notify us at the above number.     Remember:  Do not eat or drink after midnight the night before your surgery     Take these medicines the morning of surgery with A SIP OF WATER  cycloSPORINE (RESTASIS)  HYDROcodone-acetaminophen (NORCO)  levothyroxine (SYNTHROID)  minocycline (MINOCIN)  omeprazole (PRILOSEC)    May take these medicines IF NEEDED: cephALEXin (KEFLEX)  diazepam (VALIUM)    One week prior to surgery, STOP taking any Aspirin (unless otherwise instructed by your surgeon) Aleve, Naproxen, Ibuprofen, Motrin, Advil, Goody's, BC's, all herbal medications, fish oil, and non-prescription vitamins.                     Do NOT Smoke (Tobacco/Vaping) for 24 hours prior to your procedure.  If you use a CPAP at night, you may bring your mask/headgear for your overnight stay.   You will be asked to remove any contacts, glasses, piercing's, hearing aid's, dentures/partials prior to surgery. Please bring cases for these items if needed.    Patients discharged the day of surgery will not be allowed to drive home, and someone needs to stay with them for 24 hours.  SURGICAL WAITING ROOM VISITATION Patients may have no more than 2 support people in the waiting area - these visitors may rotate.   Pre-op nurse will coordinate an appropriate time for 1 ADULT support person, who may not rotate, to accompany patient in pre-op.  Children under the age of 77 must have an adult with them who is not the patient and must remain in  the main waiting area with an adult.  If the patient needs to stay at the hospital during part of their recovery, the visitor guidelines for inpatient rooms apply.  Please refer to the [facility] website for the visitor guidelines for any additional information.   If you received a COVID test during your pre-op visit  it is requested that you wear a mask when out in public, stay away from anyone that may not be feeling well and notify your surgeon if you develop symptoms. If you have been in contact with anyone that has tested positive in the last 10 days please notify you surgeon.      Pre-operative 5 CHG Bathing Instructions   You can play a key role in reducing the risk of infection after surgery. Your skin needs to be as free of germs as possible. You can reduce the number of germs on your skin by washing with CHG (chlorhexidine gluconate) soap before surgery. CHG is an antiseptic soap that kills germs and continues to kill germs even after washing.   DO NOT use if you have an allergy to chlorhexidine/CHG or antibacterial soaps. If your skin becomes reddened or irritated, stop using the CHG and notify one of our RNs at [phone].   Please shower with the CHG soap starting 4 days before surgery using the following schedule:     Please keep in mind the following:  DO NOT shave, including legs and underarms, starting the day of your  first shower.   You may shave your face at any point before/day of surgery.  Place clean sheets on your bed the day you start using CHG soap. Use a clean washcloth (not used since being washed) for each shower. DO NOT sleep with pets once you start using the CHG.   CHG Shower Instructions:  Wash your face and private area with normal soap. If you choose to wash your hair, wash first with your normal shampoo.  After you use shampoo/soap, rinse your hair and body thoroughly to remove shampoo/soap residue.  Turn the water OFF and apply about 3  tablespoons (45 ml) of CHG soap to a CLEAN washcloth.  Apply CHG soap ONLY FROM YOUR NECK DOWN TO YOUR TOES (washing for 3-5 minutes)  DO NOT use CHG soap on face, private areas, open wounds, or sores.  Pay special attention to the area where your surgery is being performed.  If you are having back surgery, having someone wash your back for you may be helpful. Wait 2 minutes after CHG soap is applied, then you may rinse off the CHG soap.  Pat dry with a clean towel  Put on clean clothes/pajamas   If you choose to wear lotion, please use ONLY the CHG-compatible lotions on the back of this paper.   Additional instructions for the day of surgery: DO NOT APPLY any lotions, deodorants, cologne, or perfumes.   Do not bring valuables to the hospital. [facility] is not responsible for any belongings/valuables. Do not wear nail polish, gel polish, artificial nails, or any other type of covering on natural nails (fingers and toes) Do not wear jewelry or makeup Put on clean/comfortable clothes.  Please brush your teeth.  Ask your nurse before applying any prescription medications to the skin.     CHG Compatible Lotions   Aveeno Moisturizing lotion  Cetaphil Moisturizing Cream  Cetaphil Moisturizing Lotion  Clairol Herbal Essence Moisturizing Lotion, Dry Skin  Clairol Herbal Essence Moisturizing Lotion, Extra Dry Skin  Clairol Herbal Essence Moisturizing Lotion, Normal Skin  Curel Age Defying Therapeutic Moisturizing Lotion with Alpha Hydroxy  Curel Extreme Care Body Lotion  Curel Soothing Hands Moisturizing Hand Lotion  Curel Therapeutic Moisturizing Cream, Fragrance-Free  Curel Therapeutic Moisturizing Lotion, Fragrance-Free  Curel Therapeutic Moisturizing Lotion, Original Formula  Eucerin Daily Replenishing Lotion  Eucerin Dry Skin Therapy Plus Alpha Hydroxy Crme  Eucerin Dry Skin Therapy Plus Alpha Hydroxy Lotion  Eucerin Original Crme  Eucerin Original Lotion  Eucerin Plus Crme  Eucerin Plus Lotion  Eucerin TriLipid Replenishing Lotion  Keri Anti-Bacterial Hand Lotion  Keri Deep Conditioning Original Lotion Dry Skin Formula Softly Scented  Keri Deep Conditioning Original Lotion, Fragrance Free Sensitive Skin Formula  Keri Lotion Fast Absorbing Fragrance Free Sensitive Skin Formula  Keri Lotion Fast Absorbing Softly Scented Dry Skin Formula  Keri Original Lotion  Keri Skin Renewal Lotion Keri Silky Smooth Lotion  Keri Silky Smooth Sensitive Skin Lotion  Nivea Body Creamy Conditioning Oil  Nivea Body Extra Enriched Lotion  Nivea Body Original Lotion  Nivea Body Sheer Moisturizing Lotion Nivea Crme  Nivea Skin Firming Lotion  NutraDerm 30 Skin Lotion  NutraDerm Skin Lotion  NutraDerm Therapeutic Skin Cream  NutraDerm Therapeutic Skin Lotion  ProShield Protective Hand Cream  Provon moisturizing lotion  Please read over the following fact sheets that you were given.

## 2022-11-20 ENCOUNTER — Encounter: Payer: Self-pay | Admitting: Gastroenterology

## 2022-11-20 ENCOUNTER — Encounter (HOSPITAL_COMMUNITY)
Admission: RE | Admit: 2022-11-20 | Discharge: 2022-11-20 | Disposition: A | Discharge status: Home / Self Care | Payer: Managed Care, Other (non HMO) | Source: Ambulatory Visit | Attending: Neurological Surgery | Admitting: Neurological Surgery

## 2022-11-20 ENCOUNTER — Other Ambulatory Visit: Payer: Self-pay

## 2022-11-20 ENCOUNTER — Encounter (HOSPITAL_COMMUNITY): Payer: Self-pay

## 2022-11-20 VITALS — BP 165/70 | HR 90 | Temp 98.1°F | Resp 16 | Ht 66.0 in | Wt 201.0 lb

## 2022-11-20 DIAGNOSIS — E785 Hyperlipidemia, unspecified: Secondary | ICD-10-CM | POA: Insufficient documentation

## 2022-11-20 DIAGNOSIS — I10 Essential (primary) hypertension: Secondary | ICD-10-CM | POA: Diagnosis not present

## 2022-11-20 DIAGNOSIS — Z87891 Personal history of nicotine dependence: Secondary | ICD-10-CM | POA: Diagnosis not present

## 2022-11-20 DIAGNOSIS — Z01812 Encounter for preprocedural laboratory examination: Secondary | ICD-10-CM | POA: Diagnosis present

## 2022-11-20 DIAGNOSIS — M9904 Segmental and somatic dysfunction of sacral region: Secondary | ICD-10-CM | POA: Diagnosis not present

## 2022-11-20 DIAGNOSIS — K219 Gastro-esophageal reflux disease without esophagitis: Secondary | ICD-10-CM | POA: Diagnosis not present

## 2022-11-20 DIAGNOSIS — E063 Autoimmune thyroiditis: Secondary | ICD-10-CM | POA: Diagnosis not present

## 2022-11-20 DIAGNOSIS — H3530 Unspecified macular degeneration: Secondary | ICD-10-CM | POA: Diagnosis not present

## 2022-11-20 DIAGNOSIS — K449 Diaphragmatic hernia without obstruction or gangrene: Secondary | ICD-10-CM | POA: Diagnosis not present

## 2022-11-20 DIAGNOSIS — Z01818 Encounter for other preprocedural examination: Secondary | ICD-10-CM

## 2022-11-20 HISTORY — DX: Gastro-esophageal reflux disease without esophagitis: K21.9

## 2022-11-20 HISTORY — DX: Headache, unspecified: R51.9

## 2022-11-20 HISTORY — DX: Anxiety disorder, unspecified: F41.9

## 2022-11-20 HISTORY — DX: Supraventricular tachycardia, unspecified: I47.10

## 2022-11-20 LAB — BASIC METABOLIC PANEL
Anion gap: 8 (ref 5–15)
BUN: 13 mg/dL (ref 8–23)
CO2: 27 mmol/L (ref 22–32)
Calcium: 9.8 mg/dL (ref 8.9–10.3)
Chloride: 107 mmol/L (ref 98–111)
Creatinine, Ser: 0.89 mg/dL (ref 0.44–1.00)
GFR, Estimated: >60 mL/min (ref >60)
Glucose, Bld: 104 mg/dL — ABNORMAL HIGH (ref 70–99)
Potassium: 4.4 mmol/L (ref 3.5–5.1)
Sodium: 142 mmol/L (ref 135–145)

## 2022-11-20 LAB — CBC
HCT: 41.5 % (ref 36.0–46.0)
Hemoglobin: 13.3 g/dL (ref 12.0–15.0)
MCH: 29.8 pg (ref 26.0–34.0)
MCHC: 32 g/dL (ref 30.0–36.0)
MCV: 92.8 fL (ref 80.0–100.0)
Platelets: 173 10*3/uL (ref 150–400)
RBC: 4.47 MIL/uL (ref 3.87–5.11)
RDW: 11.8 % (ref 11.5–15.5)
WBC: 3.9 10*3/uL — ABNORMAL LOW (ref 4.0–10.5)
nRBC: 0 % (ref 0.0–0.2)

## 2022-11-20 LAB — TYPE AND SCREEN
ABO/RH(D): B NEG
Antibody Screen: NEGATIVE

## 2022-11-20 LAB — SURGICAL PCR SCREEN
MRSA, PCR: NEGATIVE
Staphylococcus aureus: NEGATIVE

## 2022-11-20 NOTE — Progress Notes (Signed)
PCP - Ladora Daniel, PA-C Cardiologist - Dr. Fawn Kirk- pt saw on 11/8 (she says she does not need to f/u unless she's having any issues)  PPM/ICD - denies   Chest x-ray - 06/18/18- [facility] EKG - 05/15/22- tracing requested Stress Test - denies ECHO - 06/20/22 Cardiac Cath - denies  Sleep Study - denies   DM- denies  ASA/Blood Thinner Instructions: n/a   ERAS Protcol - no, NPO   COVID TEST- n/a   Anesthesia review: yes, pt saw Dr. Fransisco Beau 11/8  Patient denies shortness of breath, fever, cough and chest pain at PAT appointment   All instructions explained to the patient, with a verbal understanding of the material. Patient agrees to go over the instructions while at home for a better understanding.  The opportunity to ask questions was provided.

## 2022-11-21 ENCOUNTER — Encounter (HOSPITAL_COMMUNITY): Payer: Self-pay | Admitting: Vascular Surgery

## 2022-11-21 NOTE — Anesthesia Preprocedure Evaluation (Signed)
Anesthesia Evaluation    Airway        Dental   Pulmonary former smoker          Cardiovascular hypertension,      Neuro/Psych    GI/Hepatic   Endo/Other    Renal/GU      Musculoskeletal   Abdominal   Peds  Hematology   Anesthesia Other Findings   Reproductive/Obstetrics                             Anesthesia Physical Anesthesia Plan  ASA:   Anesthesia Plan:    Post-op Pain Management:    Induction:   PONV Risk Score and Plan:   Airway Management Planned:   Additional Equipment:   Intra-op Plan:   Post-operative Plan:   Informed Consent:   Plan Discussed with:   Anesthesia Plan Comments: (PAT note written 11/21/2022 by Shonna Chock, PA-C.  )       Anesthesia Quick Evaluation

## 2022-11-21 NOTE — Progress Notes (Signed)
Anesthesia Chart Review:  Case: [id number] Date/Time: 11/26/22 1228   Procedures:      MIS SI JOINT FUSION (Right) - 3C     Application of O-Arm (Right)   Anesthesia type: General   Pre-op diagnosis: SI JOINT DYSFUNCTION   Location: [facility] OR ROOM 20 / [facility] OR   Surgeons: Dawley, Alan Mulder, DO       DISCUSSION: Patient is a 67 year old female scheduled for the above procedure.  History includes smoker (quit 04/14/79), HTN, HLD, GERD, hiatal hernia, PSVT, hypothyroidism (due to Hashimoto's thyroiditis), macular degeneration, chronic headache, venous insufficiency (s/p laser vein ablation left GSV 06/10/17 & 02/24/18, laser vein ablation right GSV 07/22/17), spinal surgery (L4-5 fusion), hysterectomy.   11/11/22 EGD showed 1 cm hiatal hernia, benign gastric inlet patch of the proximal esophagus with otherwise normal esophagus that was empirically dilated, erythematous mucosa in the gastric fundus and gastric body (no significant pathology, no H. Pylori), a few fundic gland gastric polyps, normal examined duodenum.  She is followed by [facility] cardiologist Dr. Fransisco Beau for HTN, elevated coronary calcium score (01/22/21 70, 50-75th percentile), and possible familial HLD.  She has been intolerant to statins in the past and is currently on PCSK9 inhibitor.  She denied chest pain symptoms.  Recently exercise limited due to back pain and noted she was considering surgery in the near future. June 2024 Holter monitor showed underlying sinus rhythm with 18 brief SVT episodes, 0.09% PAC burden, 0.02% PVCs. June 2024 echo showed LVEF 55-60%, mild diastolic dysfunction. No medication changes made. She was advised primary care follow-up.   Anesthesia team to evaluate on the day of surgery.    VS: BP (!) 165/70   Pulse 90   Temp 36.7 C   Resp 16   Ht 5\' 6"  (1.676 m)   Wt 91.2 kg   SpO2 100%   BMI 32.44 kg/m    PROVIDERS: Ladora Daniel, PA-C is PCP  Fawn Kirk, MD is cardiologist Ileene Patrick, MD is GI   LABS:  Labs reviewed: Acceptable for surgery. (all labs ordered are listed, but only abnormal results are displayed)  Labs Reviewed  BASIC METABOLIC PANEL - Abnormal; Notable for the following components:      Result Value   Glucose, Bld 104 (*)    All other components within normal limits  CBC - Abnormal; Notable for the following components:   WBC 3.9 (*)    All other components within normal limits  SURGICAL PCR SCREEN  TYPE AND SCREEN     IMAGES: MR/MRCP Abd 11/09/22: IMPRESSION: 1. Extrahepatic biliary duct dilatation without obstructing lesion or filling defect. Favor benign post cholecystectomy dilatation. 2. Mild hepatic steatosis. 3. Fatty replacement of the pancreas. No evidence of pancreatic inflammation.   MRI L-spine 06/30/21: IMPRESSION: Postoperative and degenerative changes as detailed above similar to the prior study. No high-grade stenosis. Slightly increased foraminal narrowing at L3-L4.    EKG: 05/15/2022 [facility]): Sinus rhythm.  Left axis deviation.   CV: Echo 06/20/22 ([facility]): Left Ventricle: Left ventricle size is normal.    Left Ventricle: Systolic function is normal. EF: 55-60%.    Left Ventricle: Doppler parameters consistent with mild diastolic  dysfunction and low to normal LA pressure.    Right Ventricle: Right ventricle size is normal.    Right Ventricle: Systolic function is normal.    Mobile Telemetry 06/11/22 (monitored for 6d 23 h; [facility]): - Underlying rhythm is Sinus. The minimum heart rate was 51 bpm; the maximum 116  bpm; the average 72 bpm.  - No evidence of atrial flutter/fibrillation, no evidence of high-grade AV block or pathological pauses  - 18 supraventricular episodes were found. Longest SVT Episode 10 beats, Fastest SVT 141 bpm. - There were a total of 111 PVCs with 3 morphologies and 0 couplets. Overall PVC Burden at 0.02 %. - There were a total of 0 Other Beats. There were 0 total number of paced beats.  - There  were a total of 657 PSVCs with 1 morphologies and 17 couplets. Overall PSVC Burden at 0.09 %  - There is a total of 8 patient events-some of them are associated with sinus tachycardia.   CT Cardiac Calcium Scoring 01/22/21 ([facility]): FINDINGS:  CALCIUM SCORE: 70.  LM: 0.  LAD: 0.  Cx: 66.  RCA: 6  PDA: 0   IMPRESSION: Mild calcified coronary artery plaque putting the patient between the 50th and 75th percentiles for age.    Past Medical History:  Diagnosis Date   Allergic rhinitis    Anxiety    Arthritis    GERD (gastroesophageal reflux disease)    Headache    chronic   Hiatal hernia    Hyperlipidemia    Hypertension    Hypothyroidism due to Hashimoto's thyroiditis    Macular degeneration disease    Osteoarthritis    SVT (supraventricular tachycardia) (HCC)     Past Surgical History:  Procedure Laterality Date   APPENDECTOMY     BACK SURGERY     BIOPSY THYROID     BREAST EXCISIONAL BIOPSY Left    CATARACT EXTRACTION     CESAREAN SECTION     CHOLECYSTECTOMY     COLONOSCOPY     with EGD Dr Loreta Ave 2020   HAND SURGERY Right    LRTI with suspension w/ Dr. Amanda Pea   IR EMBO VENOUS NOT HEMORR HEMANG  INC GUIDE ROADMAPPING  06/10/2017   IR EMBO VENOUS NOT HEMORR HEMANG  INC GUIDE ROADMAPPING  07/22/2017   IR EMBO VENOUS NOT HEMORR HEMANG  INC GUIDE ROADMAPPING  02/24/2018   LAMINECTOMY     l4-s1   TONSILLECTOMY     VAGINAL HYSTERECTOMY     YAG LASER APPLICATION      MEDICATIONS:  Ascorbic Acid (VITAMIN C) 1000 MG tablet   betamethasone dipropionate (DIPROLENE) 0.05 % ointment   Calcium-Vitamin D (CALTRATE 600 PLUS-VIT D PO)   cephALEXin (KEFLEX) 250 MG capsule   Cholecalciferol (VITAMIN D3) 50 MCG (2000 UT) capsule   conjugated estrogens (PREMARIN) vaginal cream   cycloSPORINE (RESTASIS) 0.05 % ophthalmic emulsion   diazepam (VALIUM) 10 MG tablet   docusate sodium (COLACE) 100 MG capsule   erythromycin with ethanol (EMGEL) 2 % gel   estradiol (VIVELLE-DOT)  0.025 MG/24HR   Evolocumab (REPATHA SURECLICK) 140 MG/ML SOAJ   fluticasone (FLONASE) 50 MCG/ACT nasal spray   guaiFENesin (MUCINEX) 600 MG 12 hr tablet   HYDROcodone-acetaminophen (NORCO) 10-325 MG per tablet   latanoprost (XALATAN) 0.005 % ophthalmic solution   levothyroxine (SYNTHROID) 125 MCG tablet   minocycline (MINOCIN) 50 MG capsule   Multiple Vitamins-Minerals (EYE VITAMINS) CAPS   Omega-3 Fatty Acids (FISH OIL) 1200 MG CAPS   omeprazole (PRILOSEC) 20 MG capsule   polyethylene glycol (MIRALAX / GLYCOLAX) 17 g packet   saccharomyces boulardii (FLORASTOR) 250 MG capsule   Simethicone (GAS-X PO)   sodium chloride (OCEAN) 0.65 % SOLN nasal spray   tacrolimus (PROTOPIC) 0.1 % ointment   valsartan (DIOVAN) 80 MG tablet  No current facility-administered medications for this encounter.  Keflex is written as as needed.  Shonna Chock, PA-C Surgical Short Stay/Anesthesiology [facility] Phone [phone] [facility] Phone [phone] 11/21/2022 1:31 PM

## 2022-11-22 NOTE — Telephone Encounter (Signed)
Inbound call from patient stating she will not be able to come in at the that time of day on 02/11/23 appointment. States she is unable to drive in the dark and requesting a earlier appointment. Requesting a call back. Please advise, thank you.

## 2022-12-09 NOTE — Progress Notes (Signed)
Patient states that her original procedure date (11/19) was canceled due to a tooth infection. Patient states that she has since then had another root canal and completed a course of amoxicillin.  She also states that Dr. Mattie Marlin office is aware.  I have left a message at Dr. Mattie Marlin office to confirm.    Patient stated that she has not had a fever in over a week and feels much better - still has some swelling and soreness.    Patient denies any other changes since she was last seen in PAT.  Patient aware of arrival time.

## 2022-12-10 ENCOUNTER — Ambulatory Visit (HOSPITAL_COMMUNITY)
Admission: RE | Admit: 2022-12-10 | Payer: Managed Care, Other (non HMO) | Source: Home / Self Care | Admitting: Neurological Surgery

## 2022-12-10 DIAGNOSIS — Z01818 Encounter for other preprocedural examination: Secondary | ICD-10-CM

## 2022-12-10 SURGERY — SACROILIAC JOINT FUSION
Anesthesia: General | Laterality: Right

## 2022-12-30 ENCOUNTER — Other Ambulatory Visit (HOSPITAL_COMMUNITY): Payer: Managed Care, Other (non HMO)

## 2023-01-10 NOTE — Pre-Procedure Instructions (Signed)
 Surgical Instructions   Your procedure is scheduled on January 23, 2023. Report to [facility] Main Entrance A at 5:30 A.M., then check in with the Admitting office. Any questions or running late day of surgery: call [phone]  Questions prior to your surgery date: call [phone], Monday-Friday, 8am-4pm. If you experience any cold or flu symptoms such as cough, fever, chills, shortness of breath, etc. between now and your scheduled surgery, please notify us  at the above number.     Remember:  Do not eat after midnight the night before your surgery  You may drink clear liquids until 4:30 AM the morning of your surgery.   Clear liquids allowed are: Water, Non-Citrus Juices (without pulp), Carbonated Beverages, Clear Tea (no milk, honey, etc.), Black Coffee Only (NO MILK, CREAM OR POWDERED CREAMER of any kind), and Gatorade.    Take these medicines the morning of surgery with A SIP OF WATER: cycloSPORINE  (RESTASIS ) ophthalmic emulsion  docusate sodium  (COLACE)  HYDROcodone-acetaminophen  (NORCO)  levothyroxine  (SYNTHROID )  minocycline  (MINOCIN )  omeprazole  (PRILOSEC)    May take these medicines IF NEEDED: cetirizine (ZYRTEC)  diazepam  (VALIUM )    One week prior to surgery, STOP taking any Aspirin (unless otherwise instructed by your surgeon) Aleve, Naproxen, Ibuprofen, Motrin, Advil, Goody's, BC's, all herbal medications, fish oil, and non-prescription vitamins.                     Do NOT Smoke (Tobacco/Vaping) for 24 hours prior to your procedure.  If you use a CPAP at night, you may bring your mask/headgear for your overnight stay.   You will be asked to remove any contacts, glasses, piercing's, hearing aid's, dentures/partials prior to surgery. Please bring cases for these items if needed.    Patients discharged the day of surgery will not be allowed to drive home, and someone needs to stay with them for 24 hours.  SURGICAL WAITING ROOM VISITATION Patients may have  no more than 2 support people in the waiting area - these visitors may rotate.   Pre-op nurse will coordinate an appropriate time for 1 ADULT support person, who may not rotate, to accompany patient in pre-op.  Children under the age of 55 must have an adult with them who is not the patient and must remain in the main waiting area with an adult.  If the patient needs to stay at the hospital during part of their recovery, the visitor guidelines for inpatient rooms apply.  Please refer to the [facility] website for the visitor guidelines for any additional information.   If you received a COVID test during your pre-op visit  it is requested that you wear a mask when out in public, stay away from anyone that may not be feeling well and notify your surgeon if you develop symptoms. If you have been in contact with anyone that has tested positive in the last 10 days please notify you surgeon.      Pre-operative 5 CHG Bathing Instructions   You can play a key role in reducing the risk of infection after surgery. Your skin needs to be as free of germs as possible. You can reduce the number of germs on your skin by washing with CHG (chlorhexidine  gluconate) soap before surgery. CHG is an antiseptic soap that kills germs and continues to kill germs even after washing.   DO NOT use if you have an allergy to chlorhexidine /CHG or antibacterial soaps. If your skin becomes reddened or irritated, stop using the  CHG and notify one of our RNs at [phone].   Please shower with the CHG soap starting 4 days before surgery using the following schedule:     Please keep in mind the following:  DO NOT shave, including legs and underarms, starting the day of your first shower.   You may shave your face at any point before/day of surgery.  Place clean sheets on your bed the day you start using CHG soap. Use a clean washcloth (not used since being washed) for each shower. DO NOT sleep with pets once you start  using the CHG.   CHG Shower Instructions:  Wash your face and private area with normal soap. If you choose to wash your hair, wash first with your normal shampoo.  After you use shampoo/soap, rinse your hair and body thoroughly to remove shampoo/soap residue.  Turn the water OFF and apply about 3 tablespoons (45 ml) of CHG soap to a CLEAN washcloth.  Apply CHG soap ONLY FROM YOUR NECK DOWN TO YOUR TOES (washing for 3-5 minutes)  DO NOT use CHG soap on face, private areas, open wounds, or sores.  Pay special attention to the area where your surgery is being performed.  If you are having back surgery, having someone wash your back for you may be helpful. Wait 2 minutes after CHG soap is applied, then you may rinse off the CHG soap.  Pat dry with a clean towel  Put on clean clothes/pajamas   If you choose to wear lotion, please use ONLY the CHG-compatible lotions on the back of this paper.   Additional instructions for the day of surgery: DO NOT APPLY any lotions, deodorants, cologne, or perfumes.   Do not bring valuables to the hospital. [facility] is not responsible for any belongings/valuables. Do not wear nail polish, gel polish, artificial nails, or any other type of covering on natural nails (fingers and toes) Do not wear jewelry or makeup Put on clean/comfortable clothes.  Please brush your teeth.  Ask your nurse before applying any prescription medications to the skin.     CHG Compatible Lotions   Aveeno Moisturizing lotion  Cetaphil Moisturizing Cream  Cetaphil Moisturizing Lotion  Clairol Herbal Essence Moisturizing Lotion, Dry Skin  Clairol Herbal Essence Moisturizing Lotion, Extra Dry Skin  Clairol Herbal Essence Moisturizing Lotion, Normal Skin  Curel Age Defying Therapeutic Moisturizing Lotion with Alpha Hydroxy  Curel Extreme Care Body Lotion  Curel Soothing Hands Moisturizing Hand Lotion  Curel Therapeutic Moisturizing Cream, Fragrance-Free  Curel Therapeutic  Moisturizing Lotion, Fragrance-Free  Curel Therapeutic Moisturizing Lotion, Original Formula  Eucerin Daily Replenishing Lotion  Eucerin Dry Skin Therapy Plus Alpha Hydroxy Crme  Eucerin Dry Skin Therapy Plus Alpha Hydroxy Lotion  Eucerin Original Crme  Eucerin Original Lotion  Eucerin Plus Crme Eucerin Plus Lotion  Eucerin TriLipid Replenishing Lotion  Keri Anti-Bacterial Hand Lotion  Keri Deep Conditioning Original Lotion Dry Skin Formula Softly Scented  Keri Deep Conditioning Original Lotion, Fragrance Free Sensitive Skin Formula  Keri Lotion Fast Absorbing Fragrance Free Sensitive Skin Formula  Keri Lotion Fast Absorbing Softly Scented Dry Skin Formula  Keri Original Lotion  Keri Skin Renewal Lotion Keri Silky Smooth Lotion  Keri Silky Smooth Sensitive Skin Lotion  Nivea Body Creamy Conditioning Oil  Nivea Body Extra Enriched Teacher, Adult Education Moisturizing Lotion Nivea Crme  Nivea Skin Firming Lotion  NutraDerm 30 Skin Lotion  NutraDerm Skin Lotion  NutraDerm Therapeutic Skin Cream  NutraDerm Therapeutic  Skin Lotion  ProShield Protective Hand Cream  Provon moisturizing lotion  Please read over the following fact sheets that you were given.

## 2023-01-13 ENCOUNTER — Inpatient Hospital Stay (HOSPITAL_COMMUNITY)
Admission: RE | Admit: 2023-01-13 | Discharge: 2023-01-13 | Disposition: A | Discharge status: Home / Self Care | Payer: Managed Care, Other (non HMO) | Source: Ambulatory Visit

## 2023-01-17 NOTE — Progress Notes (Signed)
 Surgical Instructions     Your procedure is scheduled on January 23, 2023. Report to [facility] Main Entrance A at 5:30 A.M., then check in with the Admitting office. Any questions or running late day of surgery: call [phone]   Questions prior to your surgery date: call [phone], Monday-Friday, 8am-4pm. If you experience any cold or flu symptoms such as cough, fever, chills, shortness of breath, etc. between now and your scheduled surgery, please notify us  at the above number.            Remember:       Do not eat after midnight the night before your surgery   You may drink clear liquids until 4:30 AM the morning of your surgery.   Clear liquids allowed are: Water, Non-Citrus Juices (without pulp), Carbonated Beverages, Clear Tea (no milk, honey, etc.), Black Coffee Only (NO MILK, CREAM OR POWDERED CREAMER of any kind), and Gatorade.          Take these medicines the morning of surgery with A SIP OF WATER: cycloSPORINE  (RESTASIS ) ophthalmic emulsion  docusate sodium  (COLACE)  HYDROcodone-acetaminophen  (NORCO)  levothyroxine  (SYNTHROID )  minocycline  (MINOCIN )  omeprazole  (PRILOSEC)      May take these medicines IF NEEDED: cetirizine (ZYRTEC)  diazepam  (VALIUM )  HYDROcodone-acetaminophen  (NORCO)      One week prior to surgery, STOP taking any Aspirin (unless otherwise instructed by your surgeon) Aleve, Naproxen, Ibuprofen, Motrin, Advil, Goody's, BC's, all herbal medications, fish oil, and non-prescription vitamins.                     Do NOT Smoke (Tobacco/Vaping) for 24 hours prior to your procedure.   If you use a CPAP at night, you may bring your mask/headgear for your overnight stay.   You will be asked to remove any contacts, glasses, piercing's, hearing aid's, dentures/partials prior to surgery. Please bring cases for these items if needed.    Patients discharged the day of surgery will not be allowed to drive home, and someone needs to stay with them for 24  hours.   SURGICAL WAITING ROOM VISITATION Patients may have no more than 2 support people in the waiting area - these visitors may rotate.   Pre-op nurse will coordinate an appropriate time for 1 ADULT support person, who may not rotate, to accompany patient in pre-op.  Children under the age of 25 must have an adult with them who is not the patient and must remain in the main waiting area with an adult.   If the patient needs to stay at the hospital during part of their recovery, the visitor guidelines for inpatient rooms apply.   Please refer to the [facility] website for the visitor guidelines for any additional information.     If you received a COVID test during your pre-op visit  it is requested that you wear a mask when out in public, stay away from anyone that may not be feeling well and notify your surgeon if you develop symptoms. If you have been in contact with anyone that has tested positive in the last 10 days please notify you surgeon.         Pre-operative 5 CHG Bathing Instructions    You can play a key role in reducing the risk of infection after surgery. Your skin needs to be as free of germs as possible. You can reduce the number of germs on your skin by washing with CHG (chlorhexidine  gluconate) soap before surgery. CHG is  an antiseptic soap that kills germs and continues to kill germs even after washing.    DO NOT use if you have an allergy to chlorhexidine /CHG or antibacterial soaps. If your skin becomes reddened or irritated, stop using the CHG and notify one of our RNs at [phone].    Please shower with the CHG soap starting 4 days before surgery using the following schedule:       Please keep in mind the following:  DO NOT shave, including legs and underarms, starting the day of your first shower.   You may shave your face at any point before/day of surgery.  Place clean sheets on your bed the day you start using CHG soap. Use a clean washcloth (not used  since being washed) for each shower. DO NOT sleep with pets once you start using the CHG.    CHG Shower Instructions:  Wash your face and private area with normal soap. If you choose to wash your hair, wash first with your normal shampoo.  After you use shampoo/soap, rinse your hair and body thoroughly to remove shampoo/soap residue.  Turn the water OFF and apply about 3 tablespoons (45 ml) of CHG soap to a CLEAN washcloth.  Apply CHG soap ONLY FROM YOUR NECK DOWN TO YOUR TOES (washing for 3-5 minutes)  DO NOT use CHG soap on face, private areas, open wounds, or sores.  Pay special attention to the area where your surgery is being performed.  If you are having back surgery, having someone wash your back for you may be helpful. Wait 2 minutes after CHG soap is applied, then you may rinse off the CHG soap.  Pat dry with a clean towel  Put on clean clothes/pajamas   If you choose to wear lotion, please use ONLY the CHG-compatible lotions on the back of this paper.    Additional instructions for the day of surgery: DO NOT APPLY any lotions, deodorants, cologne, or perfumes.   Do not bring valuables to the hospital. [facility] is not responsible for any belongings/valuables. Do not wear nail polish, gel polish, artificial nails, or any other type of covering on natural nails (fingers and toes) Do not wear jewelry or makeup Put on clean/comfortable clothes.  Please brush your teeth.  Ask your nurse before applying any prescription medications to the skin.        CHG Compatible Lotions    Aveeno Moisturizing lotion  Cetaphil Moisturizing Cream  Cetaphil Moisturizing Lotion  Clairol Herbal Essence Moisturizing Lotion, Dry Skin  Clairol Herbal Essence Moisturizing Lotion, Extra Dry Skin  Clairol Herbal Essence Moisturizing Lotion, Normal Skin  Curel Age Defying Therapeutic Moisturizing Lotion with Alpha Hydroxy  Curel Extreme Care Body Lotion  Curel Soothing Hands Moisturizing Hand  Lotion  Curel Therapeutic Moisturizing Cream, Fragrance-Free  Curel Therapeutic Moisturizing Lotion, Fragrance-Free  Curel Therapeutic Moisturizing Lotion, Original Formula  Eucerin Daily Replenishing Lotion  Eucerin Dry Skin Therapy Plus Alpha Hydroxy Crme  Eucerin Dry Skin Therapy Plus Alpha Hydroxy Lotion  Eucerin Original Crme  Eucerin Original Lotion  Eucerin Plus Crme Eucerin Plus Lotion  Eucerin TriLipid Replenishing Lotion  Keri Anti-Bacterial Hand Lotion  Keri Deep Conditioning Original Lotion Dry Skin Formula Softly Scented  Keri Deep Conditioning Original Lotion, Fragrance Free Sensitive Skin Formula  Keri Lotion Fast Absorbing Fragrance Free Sensitive Skin Formula  Keri Lotion Fast Absorbing Softly Scented Dry Skin Formula  Keri Original Lotion  Keri Skin Renewal Lotion Keri Silky Smooth Lotion  Keri Silky Smooth Sensitive  Skin Lotion  Nivea Body Creamy Conditioning Oil  Nivea Body Extra Enriched Lotion  Nivea Body Original Lotion  Nivea Body Sheer Moisturizing Lotion Nivea Crme  Nivea Skin Firming Lotion  NutraDerm 30 Skin Lotion  NutraDerm Skin Lotion  NutraDerm Therapeutic Skin Cream  NutraDerm Therapeutic Skin Lotion  ProShield Protective Hand Cream  Provon moisturizing lotion   Please read over the following fact sheets that you were given.

## 2023-01-20 ENCOUNTER — Other Ambulatory Visit: Payer: Self-pay

## 2023-01-20 ENCOUNTER — Encounter (HOSPITAL_COMMUNITY): Payer: Self-pay

## 2023-01-20 ENCOUNTER — Encounter (HOSPITAL_COMMUNITY)
Admission: RE | Admit: 2023-01-20 | Discharge: 2023-01-20 | Disposition: A | Discharge status: Home / Self Care | Payer: Managed Care, Other (non HMO) | Source: Ambulatory Visit | Attending: Neurological Surgery | Admitting: Neurological Surgery

## 2023-01-20 VITALS — BP 168/90 | HR 88 | Temp 98.1°F | Resp 17 | Ht 66.0 in | Wt 203.1 lb

## 2023-01-20 DIAGNOSIS — I10 Essential (primary) hypertension: Secondary | ICD-10-CM | POA: Insufficient documentation

## 2023-01-20 DIAGNOSIS — Z01812 Encounter for preprocedural laboratory examination: Secondary | ICD-10-CM | POA: Insufficient documentation

## 2023-01-20 DIAGNOSIS — E039 Hypothyroidism, unspecified: Secondary | ICD-10-CM | POA: Diagnosis not present

## 2023-01-20 DIAGNOSIS — M533 Sacrococcygeal disorders, not elsewhere classified: Secondary | ICD-10-CM | POA: Insufficient documentation

## 2023-01-20 DIAGNOSIS — E063 Autoimmune thyroiditis: Secondary | ICD-10-CM | POA: Diagnosis not present

## 2023-01-20 DIAGNOSIS — K219 Gastro-esophageal reflux disease without esophagitis: Secondary | ICD-10-CM | POA: Diagnosis not present

## 2023-01-20 DIAGNOSIS — Z87891 Personal history of nicotine dependence: Secondary | ICD-10-CM | POA: Diagnosis not present

## 2023-01-20 DIAGNOSIS — E785 Hyperlipidemia, unspecified: Secondary | ICD-10-CM | POA: Diagnosis not present

## 2023-01-20 DIAGNOSIS — K449 Diaphragmatic hernia without obstruction or gangrene: Secondary | ICD-10-CM | POA: Insufficient documentation

## 2023-01-20 DIAGNOSIS — F419 Anxiety disorder, unspecified: Secondary | ICD-10-CM | POA: Diagnosis not present

## 2023-01-20 DIAGNOSIS — Z01818 Encounter for other preprocedural examination: Secondary | ICD-10-CM

## 2023-01-20 HISTORY — DX: Puckering of macula, right eye: H35.371

## 2023-01-20 HISTORY — DX: Dry eye syndrome of bilateral lacrimal glands: H04.123

## 2023-01-20 HISTORY — DX: Unspecified glaucoma: H40.9

## 2023-01-20 HISTORY — DX: Unspecified blepharitis right eye, unspecified eyelid: H01.003

## 2023-01-20 LAB — BASIC METABOLIC PANEL
Anion gap: 9 (ref 5–15)
BUN: 21 mg/dL (ref 8–23)
CO2: 29 mmol/L (ref 22–32)
Calcium: 10 mg/dL (ref 8.9–10.3)
Chloride: 102 mmol/L (ref 98–111)
Creatinine, Ser: 1.03 mg/dL — ABNORMAL HIGH (ref 0.44–1.00)
GFR, Estimated: 59 mL/min — ABNORMAL LOW (ref >60)
Glucose, Bld: 95 mg/dL (ref 70–99)
Potassium: 4.1 mmol/L (ref 3.5–5.1)
Sodium: 140 mmol/L (ref 135–145)

## 2023-01-20 LAB — TYPE AND SCREEN
ABO/RH(D): B NEG
Antibody Screen: NEGATIVE

## 2023-01-20 LAB — CBC
HCT: 43.7 % (ref 36.0–46.0)
Hemoglobin: 14.3 g/dL (ref 12.0–15.0)
MCH: 30.6 pg (ref 26.0–34.0)
MCHC: 32.7 g/dL (ref 30.0–36.0)
MCV: 93.6 fL (ref 80.0–100.0)
Platelets: 185 10*3/uL (ref 150–400)
RBC: 4.67 MIL/uL (ref 3.87–5.11)
RDW: 12 % (ref 11.5–15.5)
WBC: 4.8 10*3/uL (ref 4.0–10.5)
nRBC: 0 % (ref 0.0–0.2)

## 2023-01-20 LAB — SURGICAL PCR SCREEN
MRSA, PCR: NEGATIVE
Staphylococcus aureus: NEGATIVE

## 2023-01-20 NOTE — Progress Notes (Signed)
 PCP - Tylene Meres PA-C Cardiologist - Dr. Glenn   Chest x-ray - n/a EKG - 05/15/22-C.E.--requested copy Stress Test - denies ECHO - denies Cardiac Cath - denies  Sleep Study - denies CPAP -   Pt reports she takes daily antibiotics for blepharitis. Surgery was previously delayed due to infected retained root from a root canal 10 years ago. Pt reports being treated by endodontist and tooth issue is resolved.  Anesthesia review: pending records. Elevated BP-- pt states she gets very anxious going to hospital, did take valium  before today's appt.  Patient denies shortness of breath, fever, cough and chest pain at PAT appointment   All instructions explained to the patient, with a verbal understanding of the material. Patient agrees to go over the instructions while at home for a better understanding. Patient also instructed to self quarantine after being tested for COVID-19. The opportunity to ask questions was provided.

## 2023-01-21 NOTE — Progress Notes (Signed)
 Anesthesia Chart Review:  Case: [id number] Date/Time: 01/23/23 0715   Procedures:      MIS SI JOINT FUSION (Right) - 3C     Application of O-Arm (Right)   Anesthesia type: General   Pre-op diagnosis: SI JOINT DYSFUNCTION   Location: [facility] OR ROOM 21 / [facility] OR   Surgeons: Dawley, Lani BROCKS, DO       DISCUSSION: Patient is a 68 year old female scheduled for the above procedure. Surgery previously scheduled for 11/26/22, but reportedly delayed due to infected retained root from a root canal 10 years ago. She said she is followed by an endodontist and issue has since resolved. She is on daily antibiotics (minocycline ) for chronic blepharitis.    History includes smoker (quit 04/14/79), HTN, HLD, GERD, hiatal hernia, PSVT, hypothyroidism (due to Hashimoto's thyroiditis), macular degeneration, glaucoma, anxiety, chronic headache, venous insufficiency (s/p laser vein ablation left GSV 06/10/17 & 02/24/18, laser vein ablation right GSV 07/22/17), spinal surgery (L4-5 fusion), hysterectomy.    11/11/22 EGD showed 1 cm hiatal hernia, benign gastric inlet patch of the proximal esophagus with otherwise normal esophagus that was empirically dilated, erythematous mucosa in the gastric fundus and gastric body (no significant pathology, no H. Pylori), a few fundic gland gastric polyps, normal examined duodenum.   She is followed by [facility] cardiologist Dr. Uvaldo for HTN, elevated coronary calcium score (01/22/21 70, 50-75th percentile), and possible familial HLD.  She has been intolerant to statins in the past and is currently on PCSK9 inhibitor.  She denied chest pain symptoms.  Recently exercise limited due to back pain and noted she was considering surgery in the near future. June 2024 Holter monitor showed underlying sinus rhythm with 18 brief SVT episodes, 0.09% PAC burden, 0.02% PVCs. June 2024 echo showed LVEF 55-60%, mild diastolic dysfunction. No medication changes made. She was advised primary care follow-up.     Anesthesia team to evaluate on the day of surgery.   VS: BP (!) 168/90   Pulse 88   Temp 36.7 C   Resp 17   Ht 5' 6 (1.676 m)   Wt 92.1 kg   SpO2 100%   BMI 32.78 kg/m   PROVIDERS: Samie Frederick, PA-C is PCP  Uvaldo Fusi, MD is cardiologist Leigh Standing, MD is GI   LABS: Labs reviewed: Acceptable for surgery. (all labs ordered are listed, but only abnormal results are displayed)  Labs Reviewed  BASIC METABOLIC PANEL - Abnormal; Notable for the following components:      Result Value   Creatinine, Ser 1.03 (*)    GFR, Estimated 59 (*)    All other components within normal limits  SURGICAL PCR SCREEN  CBC  TYPE AND SCREEN    IMAGES: MR/MRCP Abd 11/09/22: IMPRESSION: 1. Extrahepatic biliary duct dilatation without obstructing lesion or filling defect. Favor benign post cholecystectomy dilatation. 2. Mild hepatic steatosis. 3. Fatty replacement of the pancreas. No evidence of pancreatic inflammation.   MRI L-spine 06/30/21: IMPRESSION: Postoperative and degenerative changes as detailed above similar to the prior study. No high-grade stenosis. Slightly increased foraminal narrowing at L3-L4.     EKG: 05/15/2022 [facility]; tracing on shadow chart): Sinus rhythm.  Left axis deviation.     CV: Echo 06/20/22 ([facility]): Left Ventricle: Left ventricle size is normal.    Left Ventricle: Systolic function is normal. EF: 55-60%.    Left Ventricle: Doppler parameters consistent with mild diastolic  dysfunction and low to normal LA pressure.    Right Ventricle: Right ventricle size  is normal.    Right Ventricle: Systolic function is normal.      Mobile Telemetry 06/11/22 (monitored for 6d 23 h; [facility]): - Underlying rhythm is Sinus. The minimum heart rate was 51 bpm; the maximum 116 bpm; the average 72 bpm.  - No evidence of atrial flutter/fibrillation, no evidence of high-grade AV block or pathological pauses  - 18 supraventricular episodes were  found. Longest SVT Episode 10 beats, Fastest SVT 141 bpm. - There were a total of 111 PVCs with 3 morphologies and 0 couplets. Overall PVC Burden at 0.02 %. - There were a total of 0 Other Beats. There were 0 total number of paced beats.  - There were a total of 657 PSVCs with 1 morphologies and 17 couplets. Overall PSVC Burden at 0.09 %  - There is a total of 8 patient events-some of them are associated with sinus tachycardia.     CT Cardiac Calcium Scoring 01/22/21 ([facility]): FINDINGS:  CALCIUM SCORE: 70.  LM: 0.  LAD: 0.  Cx: 64.  RCA: 6  PDA: 0    IMPRESSION: Mild calcified coronary artery plaque putting the patient between the 50th and 75th percentiles for age.    Past Medical History:  Diagnosis Date   Allergic rhinitis    Anxiety    Arthritis    Blepharitis of both eyes    on chronic ABX   Dry eyes    GERD (gastroesophageal reflux disease)    Glaucoma    both eyes   Headache    chronic   Hiatal hernia    Hyperlipidemia    Hypertension    Hypothyroidism due to Hashimoto's thyroiditis    Macular degeneration disease    Macular pucker, right eye    Osteoarthritis    SVT (supraventricular tachycardia) (HCC)     Past Surgical History:  Procedure Laterality Date   APPENDECTOMY     BACK SURGERY     BIOPSY THYROID     BREAST EXCISIONAL BIOPSY Left    CATARACT EXTRACTION     CESAREAN SECTION     CHOLECYSTECTOMY     COLONOSCOPY     with EGD Dr Kristie 2020   HAND SURGERY Right    LRTI with suspension w/ Dr. Camella   IR EMBO VENOUS NOT HEMORR HEMANG  INC GUIDE ROADMAPPING  06/10/2017   IR EMBO VENOUS NOT HEMORR HEMANG  INC GUIDE ROADMAPPING  07/22/2017   IR EMBO VENOUS NOT HEMORR HEMANG  INC GUIDE ROADMAPPING  02/24/2018   LAMINECTOMY     l4-s1   TONSILLECTOMY     VAGINAL HYSTERECTOMY     YAG LASER APPLICATION      MEDICATIONS:  Ascorbic Acid (VITAMIN C) 1000 MG tablet   betamethasone dipropionate (DIPROLENE) 0.05 % ointment   Calcium-Vitamin D (CALTRATE  600 PLUS-VIT D PO)   cephALEXin (KEFLEX) 250 MG capsule   cetirizine (ZYRTEC) 10 MG tablet   Cholecalciferol (VITAMIN D3) 50 MCG (2000 UT) capsule   conjugated estrogens (PREMARIN) vaginal cream   cycloSPORINE  (RESTASIS ) 0.05 % ophthalmic emulsion   diazepam  (VALIUM ) 10 MG tablet   docusate sodium  (COLACE) 100 MG capsule   erythromycin with ethanol (EMGEL) 2 % gel   estradiol (VIVELLE-DOT) 0.025 MG/24HR   Evolocumab (REPATHA SURECLICK) 140 MG/ML SOAJ   fluticasone  (FLONASE  SENSIMIST) 27.5 MCG/SPRAY nasal spray   guaiFENesin  (MUCINEX ) 600 MG 12 hr tablet   HYDROcodone-acetaminophen  (NORCO) 10-325 MG per tablet   latanoprost  (XALATAN ) 0.005 % ophthalmic solution   levothyroxine  (  SYNTHROID ) 125 MCG tablet   minocycline  (MINOCIN ) 50 MG capsule   Multiple Vitamins-Minerals (PRESERVISION AREDS 2+MULTI VIT) CAPS   Omega-3 Fatty Acids (FISH OIL) 1200 MG CAPS   omeprazole  (PRILOSEC) 20 MG capsule   polyethylene glycol (MIRALAX  / GLYCOLAX ) 17 g packet   Probiotic Product (PROBIOTIC PO)   Simethicone (GAS-X PO)   sodium chloride  (OCEAN) 0.65 % SOLN nasal spray   tacrolimus (PROTOPIC) 0.1 % ointment   valsartan (DIOVAN) 80 MG tablet   No current facility-administered medications for this encounter.    Isaiah Ruder, PA-C Surgical Short Stay/Anesthesiology [facility] Phone [phone] [facility] Phone [phone] 01/21/2023 12:52 PM

## 2023-01-21 NOTE — Anesthesia Preprocedure Evaluation (Addendum)
 Anesthesia Evaluation  Patient identified by MRN, date of birth, ID band Patient awake    Reviewed: Allergy & Precautions, NPO status , Patient's Chart, lab work & pertinent test results  Airway Mallampati: II  TM Distance: >3 FB Neck ROM: Full    Dental no notable dental hx.    Pulmonary former smoker   Pulmonary exam normal        Cardiovascular hypertension, Pt. on medications  Rhythm:Regular Rate:Normal     Neuro/Psych  Headaches  Anxiety        GI/Hepatic Neg liver ROS, hiatal hernia,GERD  Medicated,,  Endo/Other  Hypothyroidism    Renal/GU negative Renal ROS  negative genitourinary   Musculoskeletal  (+) Arthritis , Osteoarthritis,    Abdominal Normal abdominal exam  (+)   Peds  Hematology Lab Results      Component                Value               Date                      WBC                      4.8                 01/20/2023                HGB                      14.3                01/20/2023                HCT                      43.7                01/20/2023                MCV                      93.6                01/20/2023                PLT                      185                 01/20/2023              Anesthesia Other Findings   Reproductive/Obstetrics                             Anesthesia Physical Anesthesia Plan  ASA: 2  Anesthesia Plan: General   Post-op Pain Management: Tylenol  PO (pre-op)* and Ketamine  IV*   Induction: Intravenous  PONV Risk Score and Plan: 3 and Ondansetron , Dexamethasone , Midazolam  and Treatment may vary due to age or medical condition  Airway Management Planned: Mask and Oral ETT  Additional Equipment: None  Intra-op Plan:   Post-operative Plan: Extubation in OR  Informed Consent: I have reviewed the patients History and Physical, chart, labs and discussed the procedure including the risks, benefits and alternatives for  the proposed anesthesia with  the patient or authorized representative who has indicated his/her understanding and acceptance.     Dental advisory given  Plan Discussed with: CRNA  Anesthesia Plan Comments: (PAT note written 01/21/2023 by Allison Zelenak, PA-C.  )       Anesthesia Quick Evaluation

## 2023-01-23 ENCOUNTER — Other Ambulatory Visit: Payer: Self-pay

## 2023-01-23 ENCOUNTER — Observation Stay (HOSPITAL_COMMUNITY)
Admission: RE | Admit: 2023-01-23 | Discharge: 2023-01-24 | Disposition: A | Discharge status: Home / Self Care | Payer: Managed Care, Other (non HMO) | Source: Ambulatory Visit | Attending: Neurological Surgery | Admitting: Neurological Surgery

## 2023-01-23 ENCOUNTER — Encounter (HOSPITAL_COMMUNITY): Payer: Self-pay | Admitting: Neurological Surgery

## 2023-01-23 ENCOUNTER — Ambulatory Visit (HOSPITAL_BASED_OUTPATIENT_CLINIC_OR_DEPARTMENT_OTHER): Payer: Self-pay | Admitting: Vascular Surgery

## 2023-01-23 ENCOUNTER — Ambulatory Visit (HOSPITAL_COMMUNITY): Payer: Managed Care, Other (non HMO)

## 2023-01-23 ENCOUNTER — Ambulatory Visit (HOSPITAL_COMMUNITY): Payer: Self-pay | Admitting: Physician Assistant

## 2023-01-23 ENCOUNTER — Encounter (HOSPITAL_COMMUNITY)
Admission: RE | Disposition: A | Discharge status: Home / Self Care | Payer: Self-pay | Source: Ambulatory Visit | Attending: Neurological Surgery

## 2023-01-23 DIAGNOSIS — I10 Essential (primary) hypertension: Secondary | ICD-10-CM | POA: Insufficient documentation

## 2023-01-23 DIAGNOSIS — Z79899 Other long term (current) drug therapy: Secondary | ICD-10-CM | POA: Diagnosis not present

## 2023-01-23 DIAGNOSIS — E039 Hypothyroidism, unspecified: Secondary | ICD-10-CM | POA: Diagnosis not present

## 2023-01-23 DIAGNOSIS — M461 Sacroiliitis, not elsewhere classified: Principal | ICD-10-CM | POA: Insufficient documentation

## 2023-01-23 DIAGNOSIS — Z87891 Personal history of nicotine dependence: Secondary | ICD-10-CM | POA: Diagnosis not present

## 2023-01-23 HISTORY — PX: SACROILIAC JOINT FUSION: SHX6088

## 2023-01-23 SURGERY — SACROILIAC JOINT FUSION
Anesthesia: General | Site: Hip | Laterality: Right

## 2023-01-23 MED ORDER — OXYCODONE HCL 5 MG PO TABS
10.0000 mg | ORAL_TABLET | ORAL | Status: DC | PRN
  Administered 2023-01-23: 7.5 mg via ORAL
  Administered 2023-01-24 (×2): 10 mg via ORAL
  Filled 2023-01-23 (×3): qty 2

## 2023-01-23 MED ORDER — LATANOPROST 0.005 % OP SOLN
1.0000 [drp] | Freq: Every day | OPHTHALMIC | Status: DC
  Administered 2023-01-23: 1 [drp] via OPHTHALMIC
  Filled 2023-01-23: qty 2.5

## 2023-01-23 MED ORDER — ONDANSETRON HCL 4 MG/2ML IJ SOLN
INTRAMUSCULAR | Status: AC
  Filled 2023-01-23: qty 2

## 2023-01-23 MED ORDER — SALINE SPRAY 0.65 % NA SOLN
1.0000 | Freq: Every evening | NASAL | Status: DC
  Administered 2023-01-23: 1 via NASAL
  Filled 2023-01-23: qty 44

## 2023-01-23 MED ORDER — METHOCARBAMOL 500 MG PO TABS
500.0000 mg | ORAL_TABLET | Freq: Four times a day (QID) | ORAL | Status: DC | PRN
  Administered 2023-01-23 – 2023-01-24 (×2): 500 mg via ORAL
  Filled 2023-01-23 (×3): qty 1

## 2023-01-23 MED ORDER — PANTOPRAZOLE SODIUM 40 MG PO TBEC
40.0000 mg | DELAYED_RELEASE_TABLET | Freq: Every day | ORAL | Status: DC
  Administered 2023-01-23: 40 mg via ORAL
  Filled 2023-01-23 (×2): qty 1

## 2023-01-23 MED ORDER — CEFAZOLIN SODIUM-DEXTROSE 2-4 GM/100ML-% IV SOLN
2.0000 g | Freq: Four times a day (QID) | INTRAVENOUS | Status: AC
  Administered 2023-01-23 (×2): 2 g via INTRAVENOUS
  Filled 2023-01-23 (×2): qty 100

## 2023-01-23 MED ORDER — MENTHOL 3 MG MT LOZG
1.0000 | LOZENGE | OROMUCOSAL | Status: DC | PRN
Start: 2023-01-23 — End: 2023-01-24

## 2023-01-23 MED ORDER — PROPOFOL 10 MG/ML IV BOLUS
INTRAVENOUS | Status: DC | PRN
  Administered 2023-01-23: 160 mg via INTRAVENOUS

## 2023-01-23 MED ORDER — ROCURONIUM BROMIDE 10 MG/ML (PF) SYRINGE
PREFILLED_SYRINGE | INTRAVENOUS | Status: AC
  Filled 2023-01-23: qty 10

## 2023-01-23 MED ORDER — ONDANSETRON HCL 4 MG/2ML IJ SOLN
4.0000 mg | Freq: Four times a day (QID) | INTRAMUSCULAR | Status: DC | PRN
Start: 2023-01-23 — End: 2023-01-24

## 2023-01-23 MED ORDER — 0.9 % SODIUM CHLORIDE (POUR BTL) OPTIME
TOPICAL | Status: DC | PRN
  Administered 2023-01-23: 1000 mL

## 2023-01-23 MED ORDER — CHLORHEXIDINE GLUCONATE 0.12 % MT SOLN
OROMUCOSAL | Status: AC
  Administered 2023-01-23: 15 mL via OROMUCOSAL
  Filled 2023-01-23: qty 15

## 2023-01-23 MED ORDER — FLUTICASONE PROPIONATE 50 MCG/ACT NA SUSP
2.0000 | Freq: Every day | NASAL | Status: DC
  Administered 2023-01-23: 2 via NASAL
  Filled 2023-01-23: qty 16

## 2023-01-23 MED ORDER — SODIUM CHLORIDE 0.9% FLUSH
3.0000 mL | Freq: Two times a day (BID) | INTRAVENOUS | Status: DC
  Administered 2023-01-23: 3 mL via INTRAVENOUS

## 2023-01-23 MED ORDER — FENTANYL CITRATE (PF) 250 MCG/5ML IJ SOLN
INTRAMUSCULAR | Status: AC
  Filled 2023-01-23: qty 5

## 2023-01-23 MED ORDER — PHENYLEPHRINE 80 MCG/ML (10ML) SYRINGE FOR IV PUSH (FOR BLOOD PRESSURE SUPPORT)
PREFILLED_SYRINGE | INTRAVENOUS | Status: AC
  Filled 2023-01-23: qty 10

## 2023-01-23 MED ORDER — BUPIVACAINE LIPOSOME 1.3 % IJ SUSP
INTRAMUSCULAR | Status: DC | PRN
  Administered 2023-01-23: 20 mL

## 2023-01-23 MED ORDER — LATANOPROST 0.005 % OP SOLN
1.0000 [drp] | Freq: Every day | OPHTHALMIC | Status: DC
  Filled 2023-01-23: qty 2.5

## 2023-01-23 MED ORDER — FENTANYL CITRATE (PF) 250 MCG/5ML IJ SOLN
INTRAMUSCULAR | Status: DC | PRN
  Administered 2023-01-23: 100 ug via INTRAVENOUS

## 2023-01-23 MED ORDER — FLUTICASONE FUROATE 27.5 MCG/SPRAY NA SUSP: 1.0000 | Freq: Every evening | NASAL | Status: DC

## 2023-01-23 MED ORDER — IRBESARTAN 75 MG PO TABS: 75.0000 mg | ORAL_TABLET | Freq: Every day | ORAL | Status: DC

## 2023-01-23 MED ORDER — ONDANSETRON HCL 4 MG/2ML IJ SOLN
INTRAMUSCULAR | Status: DC | PRN
  Administered 2023-01-23: 4 mg via INTRAVENOUS

## 2023-01-23 MED ORDER — DOCUSATE SODIUM 100 MG PO CAPS
100.0000 mg | ORAL_CAPSULE | Freq: Two times a day (BID) | ORAL | Status: DC
  Administered 2023-01-23 – 2023-01-24 (×3): 100 mg via ORAL
  Filled 2023-01-23 (×3): qty 1

## 2023-01-23 MED ORDER — LEVOTHYROXINE SODIUM 125 MCG PO TABS
125.0000 ug | ORAL_TABLET | Freq: Every day | ORAL | Status: DC
  Administered 2023-01-24: 125 ug via ORAL
  Filled 2023-01-23: qty 1

## 2023-01-23 MED ORDER — AMISULPRIDE (ANTIEMETIC) 5 MG/2ML IV SOLN
10.0000 mg | Freq: Once | INTRAVENOUS | Status: AC | PRN
  Administered 2023-01-23: 10 mg via INTRAVENOUS

## 2023-01-23 MED ORDER — ACETAMINOPHEN 325 MG PO TABS: 650.0000 mg | ORAL_TABLET | ORAL | Status: DC | PRN

## 2023-01-23 MED ORDER — HYDROMORPHONE HCL 1 MG/ML IJ SOLN
0.2500 mg | INTRAMUSCULAR | Status: DC | PRN
  Administered 2023-01-23 (×4): 0.5 mg via INTRAVENOUS

## 2023-01-23 MED ORDER — AMISULPRIDE (ANTIEMETIC) 5 MG/2ML IV SOLN
INTRAVENOUS | Status: AC
  Filled 2023-01-23: qty 2

## 2023-01-23 MED ORDER — LACTATED RINGERS IV SOLN: INTRAVENOUS | Status: DC

## 2023-01-23 MED ORDER — RISAQUAD PO CAPS
1.0000 | ORAL_CAPSULE | Freq: Every day | ORAL | Status: DC
  Administered 2023-01-23: 1 via ORAL
  Filled 2023-01-23 (×2): qty 1

## 2023-01-23 MED ORDER — THROMBIN 5000 UNITS EX SOLR: OROMUCOSAL | Status: DC | PRN

## 2023-01-23 MED ORDER — FLEET ENEMA RE ENEM: 1.0000 | ENEMA | Freq: Once | RECTAL | Status: DC | PRN

## 2023-01-23 MED ORDER — PROBIOTIC PO CHEW: CHEWABLE_TABLET | Freq: Every day | ORAL | Status: DC

## 2023-01-23 MED ORDER — SODIUM CHLORIDE 0.9% FLUSH: 3.0000 mL | INTRAVENOUS | Status: DC | PRN

## 2023-01-23 MED ORDER — DEXAMETHASONE SODIUM PHOSPHATE 10 MG/ML IJ SOLN
INTRAMUSCULAR | Status: DC | PRN
  Administered 2023-01-23: 10 mg via INTRAVENOUS

## 2023-01-23 MED ORDER — LIDOCAINE-EPINEPHRINE 1 %-1:100000 IJ SOLN
INTRAMUSCULAR | Status: DC | PRN
  Administered 2023-01-23: 7.5 mL

## 2023-01-23 MED ORDER — ACETAMINOPHEN 500 MG PO TABS
ORAL_TABLET | ORAL | Status: AC
  Administered 2023-01-23: 1000 mg via ORAL
  Filled 2023-01-23: qty 2

## 2023-01-23 MED ORDER — OXYCODONE HCL 5 MG PO TABS
5.0000 mg | ORAL_TABLET | ORAL | Status: DC | PRN
  Administered 2023-01-23 (×2): 5 mg via ORAL
  Filled 2023-01-23 (×2): qty 1

## 2023-01-23 MED ORDER — PROPOFOL 10 MG/ML IV BOLUS
INTRAVENOUS | Status: AC
  Filled 2023-01-23: qty 20

## 2023-01-23 MED ORDER — ACETAMINOPHEN 650 MG RE SUPP: 650.0000 mg | RECTAL | Status: DC | PRN

## 2023-01-23 MED ORDER — GUAIFENESIN ER 600 MG PO TB12
600.0000 mg | ORAL_TABLET | Freq: Every day | ORAL | Status: DC
  Administered 2023-01-23: 600 mg via ORAL
  Filled 2023-01-23: qty 1

## 2023-01-23 MED ORDER — PHENOL 1.4 % MT LIQD: 1.0000 | OROMUCOSAL | Status: DC | PRN

## 2023-01-23 MED ORDER — CEFAZOLIN SODIUM 1 G IJ SOLR
INTRAMUSCULAR | Status: AC
Start: 2023-01-23 — End: ?
  Filled 2023-01-23: qty 20

## 2023-01-23 MED ORDER — ORAL CARE MOUTH RINSE: 15.0000 mL | Freq: Once | OROMUCOSAL | Status: AC

## 2023-01-23 MED ORDER — KETAMINE HCL 10 MG/ML IJ SOLN
INTRAMUSCULAR | Status: DC | PRN
  Administered 2023-01-23: 25 mg via INTRAVENOUS

## 2023-01-23 MED ORDER — CHLORHEXIDINE GLUCONATE 0.12 % MT SOLN: 15.0000 mL | Freq: Once | OROMUCOSAL | Status: DC

## 2023-01-23 MED ORDER — BUPIVACAINE-EPINEPHRINE (PF) 0.5% -1:200000 IJ SOLN
INTRAMUSCULAR | Status: DC | PRN
  Administered 2023-01-23: 7.5 mL

## 2023-01-23 MED ORDER — THROMBIN 5000 UNITS EX SOLR
CUTANEOUS | Status: AC
  Filled 2023-01-23: qty 5000

## 2023-01-23 MED ORDER — CEFAZOLIN SODIUM-DEXTROSE 2-3 GM-%(50ML) IV SOLR
INTRAVENOUS | Status: DC | PRN
  Administered 2023-01-23: 2 g via INTRAVENOUS

## 2023-01-23 MED ORDER — MIDAZOLAM HCL 2 MG/2ML IJ SOLN
INTRAMUSCULAR | Status: AC
  Filled 2023-01-23: qty 2

## 2023-01-23 MED ORDER — METHOCARBAMOL 1000 MG/10ML IJ SOLN: 500.0000 mg | Freq: Four times a day (QID) | INTRAMUSCULAR | Status: DC | PRN

## 2023-01-23 MED ORDER — POLYETHYLENE GLYCOL 3350 17 G PO PACK: 17.0000 g | PACK | Freq: Every day | ORAL | Status: DC | PRN

## 2023-01-23 MED ORDER — HYDROMORPHONE HCL 1 MG/ML IJ SOLN
INTRAMUSCULAR | Status: AC
  Filled 2023-01-23: qty 1

## 2023-01-23 MED ORDER — BUPIVACAINE-EPINEPHRINE (PF) 0.5% -1:200000 IJ SOLN
INTRAMUSCULAR | Status: AC
  Filled 2023-01-23: qty 30

## 2023-01-23 MED ORDER — KETAMINE HCL 50 MG/5ML IJ SOSY
PREFILLED_SYRINGE | INTRAMUSCULAR | Status: AC
  Filled 2023-01-23: qty 5

## 2023-01-23 MED ORDER — ORAL CARE MOUTH RINSE
15.0000 mL | Freq: Once | OROMUCOSAL | Status: DC
Start: 2023-01-23 — End: 2023-01-23

## 2023-01-23 MED ORDER — HYDROMORPHONE HCL 1 MG/ML IJ SOLN: 0.5000 mg | INTRAMUSCULAR | Status: DC | PRN

## 2023-01-23 MED ORDER — ONDANSETRON HCL 4 MG PO TABS: 4.0000 mg | ORAL_TABLET | Freq: Four times a day (QID) | ORAL | Status: DC | PRN

## 2023-01-23 MED ORDER — SENNA 8.6 MG PO TABS
1.0000 | ORAL_TABLET | Freq: Two times a day (BID) | ORAL | Status: DC | PRN
  Administered 2023-01-23: 8.6 mg via ORAL
  Filled 2023-01-23: qty 1

## 2023-01-23 MED ORDER — MINOCYCLINE HCL 50 MG PO CAPS
50.0000 mg | ORAL_CAPSULE | Freq: Every day | ORAL | Status: DC
  Administered 2023-01-23 – 2023-01-24 (×2): 50 mg via ORAL
  Filled 2023-01-23 (×2): qty 1

## 2023-01-23 MED ORDER — CHLORHEXIDINE GLUCONATE 0.12 % MT SOLN: 15.0000 mL | Freq: Once | OROMUCOSAL | Status: AC

## 2023-01-23 MED ORDER — CYCLOSPORINE 0.05 % OP EMUL
1.0000 [drp] | Freq: Two times a day (BID) | OPHTHALMIC | Status: DC
  Administered 2023-01-23 – 2023-01-24 (×2): 1 [drp] via OPHTHALMIC
  Filled 2023-01-23 (×2): qty 30

## 2023-01-23 MED ORDER — BUPIVACAINE LIPOSOME 1.3 % IJ SUSP
INTRAMUSCULAR | Status: AC
  Filled 2023-01-23: qty 20

## 2023-01-23 MED ORDER — LIDOCAINE 2% (20 MG/ML) 5 ML SYRINGE
INTRAMUSCULAR | Status: DC | PRN
  Administered 2023-01-23: 60 mg via INTRAVENOUS

## 2023-01-23 MED ORDER — LIDOCAINE 2% (20 MG/ML) 5 ML SYRINGE
INTRAMUSCULAR | Status: AC
  Filled 2023-01-23: qty 5

## 2023-01-23 MED ORDER — PHENYLEPHRINE 80 MCG/ML (10ML) SYRINGE FOR IV PUSH (FOR BLOOD PRESSURE SUPPORT)
PREFILLED_SYRINGE | INTRAVENOUS | Status: DC | PRN
  Administered 2023-01-23: 80 ug via INTRAVENOUS
  Administered 2023-01-23: 160 ug via INTRAVENOUS
  Administered 2023-01-23 (×3): 80 ug via INTRAVENOUS

## 2023-01-23 MED ORDER — IRBESARTAN 75 MG PO TABS
75.0000 mg | ORAL_TABLET | Freq: Every day | ORAL | Status: DC
  Filled 2023-01-23: qty 1

## 2023-01-23 MED ORDER — LIDOCAINE-EPINEPHRINE 1 %-1:100000 IJ SOLN
INTRAMUSCULAR | Status: AC
  Filled 2023-01-23: qty 1

## 2023-01-23 MED ORDER — MIDAZOLAM HCL 2 MG/2ML IJ SOLN
INTRAMUSCULAR | Status: DC | PRN
  Administered 2023-01-23: 2 mg via INTRAVENOUS

## 2023-01-23 MED ORDER — SUGAMMADEX SODIUM 200 MG/2ML IV SOLN
INTRAVENOUS | Status: DC | PRN
  Administered 2023-01-23: 200 mg via INTRAVENOUS

## 2023-01-23 MED ORDER — DIAZEPAM 2 MG PO TABS: 2.0000 mg | ORAL_TABLET | Freq: Four times a day (QID) | ORAL | Status: DC | PRN

## 2023-01-23 MED ORDER — ROCURONIUM BROMIDE 10 MG/ML (PF) SYRINGE
PREFILLED_SYRINGE | INTRAVENOUS | Status: DC | PRN
  Administered 2023-01-23: 50 mg via INTRAVENOUS
  Administered 2023-01-23: 20 mg via INTRAVENOUS

## 2023-01-23 MED ORDER — ACETAMINOPHEN 500 MG PO TABS: 1000.0000 mg | ORAL_TABLET | Freq: Once | ORAL | Status: AC

## 2023-01-23 MED ORDER — DEXAMETHASONE SODIUM PHOSPHATE 10 MG/ML IJ SOLN
INTRAMUSCULAR | Status: AC
  Filled 2023-01-23: qty 1

## 2023-01-23 SURGICAL SUPPLY — 59 items
BAG COUNTER SPONGE SURGICOUNT (BAG) ×2 IMPLANT
BONE CANC CHIPS 20CC PCAN1/4 (Bone Implant) ×2 IMPLANT
BUR 14 MATCH 3 (BUR) IMPLANT
BUR MR8 14 BALL 5 (BUR) IMPLANT
BURR 14 MATCH 3 (BUR)
BURR MR8 14 BALL 5 (BUR)
CHIPS CANC BONE 20CC PCAN1/4 (Bone Implant) ×2 IMPLANT
COVERAGE SUPPORT O-ARM STEALTH (MISCELLANEOUS) ×2 IMPLANT
DERMABOND ADVANCED .7 DNX12 (GAUZE/BANDAGES/DRESSINGS) ×2 IMPLANT
DEVICE FUSION THRD 40X12 (Joint) IMPLANT
DEVICE FUSION THRD 50X12 (Joint) IMPLANT
DRAIN JACKSON RD 7FR 3/32 (WOUND CARE) IMPLANT
DRAPE C-ARM 42X72 X-RAY (DRAPES) IMPLANT
DRAPE LAPAROTOMY 100X72X124 (DRAPES) ×2 IMPLANT
DRAPE SHEET LG 3/4 BI-LAMINATE (DRAPES) ×12 IMPLANT
DRAPE SURG 17X23 STRL (DRAPES) ×2 IMPLANT
DRSG OPSITE POSTOP 3X4 (GAUZE/BANDAGES/DRESSINGS) IMPLANT
DRSG OPSITE POSTOP 4X6 (GAUZE/BANDAGES/DRESSINGS) IMPLANT
DURAPREP 26ML APPLICATOR (WOUND CARE) ×2 IMPLANT
ELECT BLADE INSULATED 4IN (ELECTROSURGICAL) ×2
ELECT COATED BLADE 2.86 ST (ELECTRODE) ×4 IMPLANT
ELECT REM PT RETURN 9FT ADLT (ELECTROSURGICAL) ×2
ELECTRODE BLADE INSULATED 4IN (ELECTROSURGICAL) ×2 IMPLANT
ELECTRODE REM PT RTRN 9FT ADLT (ELECTROSURGICAL) ×2 IMPLANT
FEE COVERAGE SUPPORT O-ARM (MISCELLANEOUS) ×2 IMPLANT
GAUZE 4X4 16PLY ~~LOC~~+RFID DBL (SPONGE) IMPLANT
GAUZE SPONGE 4X4 12PLY STRL (GAUZE/BANDAGES/DRESSINGS) ×2 IMPLANT
GLOVE BIO SURGEON STRL SZ7 (GLOVE) ×4 IMPLANT
GLOVE BIOGEL PI IND STRL 7.5 (GLOVE) ×4 IMPLANT
GLOVE BIOGEL PI IND STRL 8 (GLOVE) ×2 IMPLANT
GLOVE ECLIPSE 8.0 STRL XLNG CF (GLOVE) ×2 IMPLANT
GOWN STRL REUS W/ TWL LRG LVL3 (GOWN DISPOSABLE) IMPLANT
GOWN STRL REUS W/ TWL XL LVL3 (GOWN DISPOSABLE) ×4 IMPLANT
GOWN STRL REUS W/TWL 2XL LVL3 (GOWN DISPOSABLE) IMPLANT
GRAFT BNE CANC CHIPS 1-8 20CC (Bone Implant) IMPLANT
HEMOSTAT POWDER KIT SURGIFOAM (HEMOSTASIS) ×2 IMPLANT
KIT BASIN OR (CUSTOM PROCEDURE TRAY) ×2 IMPLANT
KIT POSITION SURG JACKSON T1 (MISCELLANEOUS) ×2 IMPLANT
KIT TURNOVER KIT B (KITS) ×2 IMPLANT
MARKER SPHERE PSV REFLC NDI (MISCELLANEOUS) ×10 IMPLANT
NDL HYPO 21X1.5 SAFETY (NEEDLE) ×2 IMPLANT
NDL HYPO 25X1 1.5 SAFETY (NEEDLE) ×2 IMPLANT
NEEDLE HYPO 21X1.5 SAFETY (NEEDLE) ×2 IMPLANT
NEEDLE HYPO 25X1 1.5 SAFETY (NEEDLE) ×2 IMPLANT
NS IRRIG 1000ML POUR BTL (IV SOLUTION) ×2 IMPLANT
PACK LAMINECTOMY NEURO (CUSTOM PROCEDURE TRAY) ×2 IMPLANT
PAD ARMBOARD 7.5X6 YLW CONV (MISCELLANEOUS) ×6 IMPLANT
PATTIES SURGICAL .5 X1 (DISPOSABLE) IMPLANT
PIN BONE FIX 100 (PIN) IMPLANT
PUTTY GRAFTON DBF 6CC W/DELIVE (Putty) IMPLANT
SPIKE FLUID TRANSFER (MISCELLANEOUS) ×2 IMPLANT
SPONGE T-LAP 4X18 ~~LOC~~+RFID (SPONGE) IMPLANT
STAPLER VISISTAT 35W (STAPLE) ×2 IMPLANT
SUT VIC AB 2-0 CP2 18 (SUTURE) ×2 IMPLANT
SUT VIC AB 3-0 SH 8-18 (SUTURE) ×2 IMPLANT
SYR 20CC LL (SYRINGE) ×2 IMPLANT
TOWEL GREEN STERILE (TOWEL DISPOSABLE) IMPLANT
TOWEL GREEN STERILE FF (TOWEL DISPOSABLE) IMPLANT
WATER STERILE IRR 1000ML POUR (IV SOLUTION) ×2 IMPLANT

## 2023-01-23 NOTE — Op Note (Signed)
Providing Compassionate, Quality Care - Together   Date of service: 08/21/2021   PREOP DIAGNOSIS:  Right sacroiliitis   POSTOP DIAGNOSIS: Same   PROCEDURE: Right minimally invasive sacroiliac arthrodesis; [employer] [place] 12.5 x 40 m, 12.5 x 50 mm Intraoperative use of O-arm, Stealth for neuro navigation Intraoperative use of autograft, same incision Intraoperative use of allograft   SURGEON: Dr. Kendell Bane C. Antinette Keough, DO   ASSISTANT: Patrici Ranks, PA   ANESTHESIA: General Endotracheal   EBL: Minimal   SPECIMENS: None   DRAINS: None   COMPLICATIONS: None   CONDITION: Hemodynamically stable   HISTORY: This is a 68 year old female with continued progressively worsening right sacroiliitis despite conservative measures.  She underwent extensive physical therapy, injections and pain control without significant relief.  She had positive provocative testing as well as positive diagnostic blocks with significant relief and therefore offered her a right sacroiliac joint fusion.  We discussed all risks, benefits and expected outcomes as well as alternatives to treatment.  Informed consent was obtained and witnessed.   PROCEDURE IN DETAIL: The patient was brought to the operating room. After induction of general anesthesia, the patient was positioned on the operative table in the prone position. All pressure points were meticulously padded. Skin incision was then marked out and prepped and draped in the usual sterile fashion. Physician driven timeout was performed.   Using a 10 blade, a small stab incision was created over the left PSIS, and the percutaneous pin was placed into the PSIS with appropriate bony purchase.  Navigation frame was then placed, the field was sterilely covered and the O-arm was brought in for an initial intraoperative scan.  The scan was then verified with neuro navigation to have excellent accuracy.   I then planned trajectory to decorticate the right SI joint,  using a 10 blade a sharp incision was made over the right SI joint, and using the navigated 10 mm tap, I decorticated the right SI joint.  I then used this autograft and repacked it into the joint with allograft.  The wound was then hemostased with passive hemostatic agents.   I then used neuro navigation to plan the inferior and superior implants from the ilium into the sacrum avoiding the foramina.  Using a 10 blade, I made sharp incision and the planned trajectory was then tapped with a 10 mm tap on the inferior trajectory which had appropriate bony purchase across the ilium and sacrum.  I then selected a 40 mm implant, this was placed with appropriate bony purchase.   This was then repeated superiorly from the ilium to the sacrum again with care to not violate the sacral foramina.  A 50 mm implant was selected and placed with appropriate bony purchase.  The wounds were hemostased with passive hemostatic.  I then sterilely covered the field again and performed an intraoperative scan to verify appropriate hardware placement.  Imaging was reviewed and noted to be appropriately placed across the SI joint with no foramina violation.   The wounds were noted to be excellently hemostatic, long-acting anesthetic was placed in the wounds.  I removed the percutaneous pin.  I then closed the wounds with 2-0 and 3-0 Vicryl suture.  Skin was closed with skin glue, sterile dressing was applied.   At the end of the case all sponge, needle, and instrument counts were correct. The patient was then transferred to the stretcher, extubated, and taken to the post-anesthesia care unit in stable hemodynamic condition.

## 2023-01-23 NOTE — H&P (Signed)
Providing Compassionate, Quality Care - Together  NEUROSURGERY HISTORY & PHYSICAL   Diana Mcpherson is an 68 y.o. female.   Chief Complaint: Right sacroiliitis HPI: This is a 68 year old female with a longstanding history of right sacroiliac pain.  Imaging did not reveal any infectious or neoplastic pathology.  She has undergone extensive conservative measures including therapy, injections and pain control without relief of her pain.  She presents today for sacroiliac joint fusion.  Past Medical History:  Diagnosis Date   Allergic rhinitis    Anxiety    Arthritis    Blepharitis of both eyes    on chronic ABX   Dry eyes    GERD (gastroesophageal reflux disease)    Glaucoma    both eyes   Headache    chronic   Hiatal hernia    Hyperlipidemia    Hypertension    Hypothyroidism due to Hashimoto's thyroiditis    Macular degeneration disease    Macular pucker, right eye    Osteoarthritis    SVT (supraventricular tachycardia) (HCC)     Past Surgical History:  Procedure Laterality Date   APPENDECTOMY     BACK SURGERY     BIOPSY THYROID     BREAST EXCISIONAL BIOPSY Left    CATARACT EXTRACTION     CESAREAN SECTION     CHOLECYSTECTOMY     COLONOSCOPY     with EGD Dr Loreta Ave 2020   HAND SURGERY Right    LRTI with suspension w/ Dr. Amanda Pea   IR EMBO VENOUS NOT HEMORR HEMANG  INC GUIDE ROADMAPPING  06/10/2017   IR EMBO VENOUS NOT HEMORR HEMANG  INC GUIDE ROADMAPPING  07/22/2017   IR EMBO VENOUS NOT HEMORR HEMANG  INC GUIDE ROADMAPPING  02/24/2018   LAMINECTOMY     l4-s1   TONSILLECTOMY     VAGINAL HYSTERECTOMY     YAG LASER APPLICATION      Family History  Problem Relation Age of Onset   Colon polyps Mother    Dementia Mother    Heart disease Mother    Colon polyps Father    Diabetes Father    Emphysema Father    Asthma Sister    Breast cancer Maternal Aunt    Breast cancer Paternal Aunt    Breast cancer Paternal Aunt    Breast cancer Niece        21s   Colon  cancer Neg Hx    Stomach cancer Neg Hx    Esophageal cancer Neg Hx    Pancreatic cancer Neg Hx    Social History:  reports that she quit smoking about 43 years ago. Her smoking use included cigarettes. She started smoking about 50 years ago. She has a 7 pack-year smoking history. She has never used smokeless tobacco. She reports that she does not currently use drugs. She reports that she does not drink alcohol.  Allergies:  Allergies  Allergen Reactions   Duloxetine Other (See Comments) and Palpitations    Heart palpitations     Heart racing / Increased blood pressure    Elevated HR   Erythromycin Diarrhea, Other (See Comments) and Nausea Only    Diarrhea and severe heartburn   Gabapentin Palpitations, Other (See Comments) and Tinitus    Vertigo; anxiety, hallucinations   Leflunomide Anaphylaxis and Other (See Comments)    Flu like symptoms   Morphine And Codeine Nausea And Vomiting and Palpitations    headache   Beta Adrenergic Blockers Other (See Comments)  Could not stop crying/Panic   Iodinated Contrast Media Hives and Other (See Comments)    After administration of CT contrast   Nabumetone Other (See Comments)    Gi- upset Relafen   Rosuvastatin Other (See Comments)    Elevated liver enzymes   Atorvastatin Other (See Comments)    Acute Muscle spasm   Levofloxacin Other (See Comments)    Flu like symptoms and GI issues    Nsaids Other (See Comments)    GI issue of take more than once or twice a week   Doxycycline Diarrhea and Other (See Comments)   Ezetimibe Other (See Comments)    Unknown   Gadolinium Derivatives Hives    One hive with minimal itching after gadolinium  injection   Meloxicam Nausea Only, Nausea And Vomiting and Other (See Comments)   Nitrofurantoin Macrocrystal Diarrhea and Nausea And Vomiting   Tapentadol Hcl Nausea Only and Anxiety    Nucynta Weird Crying     Medications Prior to Admission  Medication Sig Dispense Refill   Ascorbic Acid  (VITAMIN C) 1000 MG tablet Take 1,000 mg by mouth at bedtime.     betamethasone dipropionate (DIPROLENE) 0.05 % ointment Apply 1 application  topically daily as needed (eczema).     Calcium-Vitamin D (CALTRATE 600 PLUS-VIT D PO) Take 1 tablet by mouth at bedtime.     cetirizine (ZYRTEC) 10 MG tablet Take 10 mg by mouth daily as needed for allergies.     Cholecalciferol (VITAMIN D3) 50 MCG (2000 UT) capsule Take 4,000 Units by mouth daily.     conjugated estrogens (PREMARIN) vaginal cream Place 1 applicator vaginally 2 (two) times a week.     cycloSPORINE (RESTASIS) 0.05 % ophthalmic emulsion Place 1 drop into both eyes 2 (two) times daily.     diazepam (VALIUM) 10 MG tablet Take 2.5-3.3 mg by mouth every 6 (six) hours as needed (muscle spasms).     docusate sodium (COLACE) 100 MG capsule Take 100 mg by mouth 2 (two) times daily.     estradiol (VIVELLE-DOT) 0.025 MG/24HR Place 1 patch onto the skin 2 (two) times a week. Sat / Wed     Evolocumab (REPATHA SURECLICK) 140 MG/ML SOAJ Inject 140 mg into the skin every 14 (fourteen) days.     fluticasone (FLONASE SENSIMIST) 27.5 MCG/SPRAY nasal spray Place 1 spray into the nose every evening.     guaiFENesin (MUCINEX) 600 MG 12 hr tablet Take 600 mg by mouth at bedtime.     HYDROcodone-acetaminophen (NORCO) 10-325 MG per tablet Take 1.5 tablets by mouth 2 (two) times daily.     latanoprost (XALATAN) 0.005 % ophthalmic solution Place 1 drop into both eyes at bedtime.     levothyroxine (SYNTHROID) 125 MCG tablet Take 125 mcg by mouth daily before breakfast.     minocycline (MINOCIN) 50 MG capsule Take 50 mg by mouth daily.     Multiple Vitamins-Minerals (PRESERVISION AREDS 2+MULTI VIT) CAPS Take 1 capsule by mouth daily.     omeprazole (PRILOSEC) 20 MG capsule Take 1 capsule (20 mg total) by mouth daily. 30 capsule 5   Probiotic Product (PROBIOTIC PO) Take 1 capsule by mouth daily.     Simethicone (GAS-X PO) Take 1 tablet by mouth daily as needed  (flatulence).     sodium chloride (OCEAN) 0.65 % SOLN nasal spray Place 1 spray into both nostrils every evening.     tacrolimus (PROTOPIC) 0.1 % ointment Apply 1 Application topically daily as needed (Eczema).  valsartan (DIOVAN) 80 MG tablet Take 80 mg by mouth daily.     cephALEXin (KEFLEX) 250 MG capsule Take 250 mg by mouth daily as needed (after intercourse).     erythromycin with ethanol (EMGEL) 2 % gel Apply 1 Application topically daily as needed (Rosacea).     Omega-3 Fatty Acids (FISH OIL) 1200 MG CAPS Take 1,200 mg by mouth at bedtime. burpless     polyethylene glycol (MIRALAX / GLYCOLAX) 17 g packet Take 17 g by mouth daily as needed for mild constipation or moderate constipation.      No results found for this or any previous visit (from the past 48 hours). No results found.  ROS All pertinent positives and negatives are listed in HPI above  Blood pressure (!) 187/84, pulse 93, temperature 97.7 F (36.5 C), temperature source Oral, resp. rate 18, height 5\' 6"  (1.676 m), weight 92.1 kg, SpO2 98%. Physical Exam  Awake alert oriented x 3, no acute distress PERRLA Cranial nerves II through XII intact Full strength in upper and lower extremities throughout Speech fluent and appropriate Face symmetric Nonlabored breathing   Assessment/Plan 68 year old female with  Right sacroiliitis   -OR today for right sacroiliac joint fusion.  We discussed all risks, benefits and expected outcomes as well as alternatives to treatment.  Informed consent was obtained and witnessed.  She has extensively exhausted all conservative measures.  Thank you for allowing me to participate in this patient's care.  Please do not hesitate to call with questions or concerns.   Monia Pouch, DO Neurosurgeon [facility] [phone]

## 2023-01-23 NOTE — Anesthesia Procedure Notes (Signed)
Procedure Name: Intubation Date/Time: 01/23/2023 7:48 AM  Performed by: Georgianne Fick D, CRNAPre-anesthesia Checklist: Patient identified, Emergency Drugs available, Suction available and Patient being monitored Patient Re-evaluated:Patient Re-evaluated prior to induction Oxygen Delivery Method: Circle System Utilized Preoxygenation: Pre-oxygenation with 100% oxygen Induction Type: IV induction Ventilation: Mask ventilation without difficulty Laryngoscope Size: Mac and Miller Grade View: Grade I Tube type: Oral Tube size: 7.0 mm Number of attempts: 1 Airway Equipment and Method: Stylet and Oral airway Placement Confirmation: ETT inserted through vocal cords under direct vision, positive ETCO2 and breath sounds checked- equal and bilateral Secured at: 20 cm Tube secured with: Tape Dental Injury: Teeth and Oropharynx as per pre-operative assessment

## 2023-01-23 NOTE — Anesthesia Postprocedure Evaluation (Signed)
Anesthesia Post Note  Patient: Diana Mcpherson  Procedure(s) Performed: Minimally Invasive Sacroiliac Joint Fusion (Right: Hip) Application of O-Arm (Right)     Patient location during evaluation: PACU Anesthesia Type: General Level of consciousness: awake and alert Pain management: pain level controlled Vital Signs Assessment: post-procedure vital signs reviewed and stable Respiratory status: spontaneous breathing, nonlabored ventilation, respiratory function stable and patient connected to nasal cannula oxygen Cardiovascular status: blood pressure returned to baseline and stable Postop Assessment: no apparent nausea or vomiting Anesthetic complications: no   No notable events documented.  Last Vitals:  Vitals:   01/23/23 1032 01/23/23 1239  BP: (!) 155/55 (!) 140/65  Pulse: 71 77  Resp: 18 20  Temp:  36.7 C  SpO2: 97% 100%    Last Pain:  Vitals:   01/23/23 1340  TempSrc:   PainSc: 4                  Schneur Crowson P Seneca Gadbois

## 2023-01-23 NOTE — Transfer of Care (Signed)
Immediate Anesthesia Transfer of Care Note  Patient: Diana Mcpherson  Procedure(s) Performed: Minimally Invasive Sacroiliac Joint Fusion (Right: Hip) Application of O-Arm (Right)  Patient Location: PACU  Anesthesia Type:General  Level of Consciousness: awake, alert , and oriented  Airway & Oxygen Therapy: Patient Spontanous Breathing and Patient connected to nasal cannula oxygen  Post-op Assessment: Report given to RN and Post -op Vital signs reviewed and stable  Post vital signs: Reviewed and stable  Last Vitals:  Vitals Value Taken Time  BP 180/83 01/23/23 0921  Temp    Pulse 86 01/23/23 0923  Resp 19 01/23/23 0923  SpO2 100 % 01/23/23 0923  Vitals shown include unfiled device data.  Last Pain:  Vitals:   01/23/23 0652  TempSrc:   PainSc: 3          Complications: No notable events documented.

## 2023-01-23 NOTE — Plan of Care (Signed)
  Problem: Education: Goal: Knowledge of General Education information will improve Description: Including pain rating scale, medication(s)/side effects and non-pharmacologic comfort measures Outcome: Progressing   Problem: Health Behavior/Discharge Planning: Goal: Ability to manage health-related needs will improve Outcome: Progressing   Problem: Clinical Measurements: Goal: Ability to maintain clinical measurements within normal limits will improve Outcome: Progressing Goal: Will remain free from infection Outcome: Progressing Goal: Diagnostic test results will improve Outcome: Progressing Goal: Respiratory complications will improve Outcome: Progressing Goal: Cardiovascular complication will be avoided Outcome: Progressing   Problem: Activity: Goal: Risk for activity intolerance will decrease Outcome: Progressing   Problem: Elimination: Goal: Will not experience complications related to bowel motility Outcome: Progressing Goal: Will not experience complications related to urinary retention Outcome: Progressing   Problem: Pain Managment: Goal: General experience of comfort will improve and/or be controlled Outcome: Progressing   Problem: Safety: Goal: Ability to remain free from injury will improve Outcome: Progressing

## 2023-01-24 ENCOUNTER — Encounter (HOSPITAL_COMMUNITY): Payer: Self-pay | Admitting: Neurological Surgery

## 2023-01-24 DIAGNOSIS — M461 Sacroiliitis, not elsewhere classified: Secondary | ICD-10-CM | POA: Diagnosis not present

## 2023-01-24 MED ORDER — OXYCODONE HCL 10 MG PO TABS: 10.0000 mg | ORAL_TABLET | ORAL | 0 refills | Status: AC | PRN

## 2023-01-24 MED ORDER — METHOCARBAMOL 500 MG PO TABS: 500.0000 mg | ORAL_TABLET | Freq: Four times a day (QID) | ORAL | 2 refills | Status: AC | PRN

## 2023-01-24 NOTE — Discharge Instructions (Addendum)
Wound Care Keep incision covered and dry until post op day 3. You may remove the Honeycomb dressing on post op day 3. Leave steri-strips on back.  They will fall off by themselves. Do not put any creams, lotions, or ointments on incision. You are fine to shower. Let water run over incision and pat dry.   Activity Walk each and every day, increasing distance each day. No lifting greater than 8 lbs.  No lifting no bending no twisting no driving ,you can ride as a passenger  locally  Diet Resume your normal diet.   Return to Work Will be discussed at your follow up appointment.  Call Your Doctor If Any of These Occur Redness, drainage, or swelling at the wound.  Temperature greater than 101 degrees. Severe pain not relieved by pain medication. Incision starts to come apart.  Follow Up Appt Call [phone] if you have one or any problem.

## 2023-01-24 NOTE — Evaluation (Signed)
Physical Therapy Evaluation  Patient Details Name: Diana Mcpherson MRN: [id number] DOB: [birth year] Today's Date: 01/24/2023  History of Present Illness  Patient is a 68 y/o female who presents s/p R SI joint fusion on 01/22/22. PMH includes:  HTN, hiatal hernia, PSVT, hypothyroidism (due to Hashimoto's thyroiditis), macular degeneration, glaucoma, anxiety, chronic headache, venous insufficiency (s/p laser vein ablation left GSV 06/10/17 & 02/24/18, laser vein ablation right GSV 07/22/17), spinal surgery (L4-5 fusion).   Clinical Impression  Pt admitted with above diagnosis. At the time of PT eval, pt was able to demonstrate transfers and ambulation with gross CGA to supervision for safety and RW for support. Pt was educated on precautions, appropriate activity progression, and car transfer. Pt currently with functional limitations due to the deficits listed below (see PT Problem List). Pt will benefit from skilled PT to increase their independence and safety with mobility to allow discharge to the venue listed below.        If plan is discharge home, recommend the following: A little help with walking and/or transfers;A little help with bathing/dressing/bathroom;Assist for transportation;Help with stairs or ramp for entrance   Can travel by private vehicle        Equipment Recommendations None recommended by PT  Recommendations for Other Services       Functional Status Assessment Patient has had a recent decline in their functional status and demonstrates the ability to make significant improvements in function in a reasonable and predictable amount of time.     Precautions / Restrictions Precautions Precautions: Fall Restrictions Weight Bearing Restrictions Per Provider Order: Yes RLE Weight Bearing Per Provider Order: Weight bearing as tolerated LLE Weight Bearing Per Provider Order: Weight bearing as tolerated      Mobility  Bed Mobility Overal bed mobility: Needs Assistance Bed  Mobility: Rolling, Sidelying to Sit Rolling: Modified independent (Device/Increase time) Sidelying to sit: Modified independent (Device/Increase time)       General bed mobility comments: Use of rails required. HOB flat and cues for log roll.    Transfers Overall transfer level: Needs assistance Equipment used: Rolling walker (2 wheels) Transfers: Sit to/from Stand Sit to Stand: Contact guard assist           General transfer comment: VC's for wide BOS and improving posture with sit<>stand.    Ambulation/Gait Ambulation/Gait assistance: Supervision Gait Distance (Feet): 200 Feet Assistive device: Rolling walker (2 wheels) Gait Pattern/deviations: Step-through pattern, Decreased stride length, Trunk flexed Gait velocity: Decreased Gait velocity interpretation: <1.8 ft/sec, indicate of risk for recurrent falls   General Gait Details: VC's for improved posture, closer walker proximity and forward gaze. No assist required but close supervision provided for safety.  Stairs Stairs: Yes Stairs assistance: Min assist, +2 safety/equipment Stair Management: One rail Right, Step to pattern, Forwards Number of Stairs: 10 General stair comments: Husband present for stair training. Husband providing HHA and guarding with gait belt, and PT with hands on guarding of both pt and husband as they negotiated the stairs. Step-by-step education provided for ascending and descending stairs.  Wheelchair Mobility     Tilt Bed    Modified Rankin (Stroke Patients Only)       Balance Overall balance assessment: Needs assistance Sitting-balance support: No upper extremity supported, Feet supported Sitting balance-Leahy Scale: Fair     Standing balance support: Bilateral upper extremity supported, During functional activity, Reliant on assistive device for balance Standing balance-Leahy Scale: Poor Standing balance comment: Reliant on RW currently, will progress  Pertinent Vitals/Pain Pain Assessment Pain Assessment: Faces Faces Pain Scale: Hurts little more Pain Location: R hip Pain Descriptors / Indicators: Grimacing, Guarding, Discomfort Pain Intervention(s): Limited activity within patient's tolerance, Monitored during session, Repositioned    Home Living Family/patient expects to be discharged to:: Private residence Living Arrangements: Spouse/significant other Available Help at Discharge: Available 24 hours/day Type of Home: House Home Access: Stairs to enter Entrance Stairs-Rails: Can reach both Entrance Stairs-Number of Steps: 5 Alternate Level Stairs-Number of Steps: 16 Home Layout: Two level Home Equipment: Agricultural consultant (2 wheels);Cane - quad;Hand held shower head;Adaptive equipment      Prior Function Prior Level of Function : Independent/Modified Independent;Driving             Mobility Comments: independent without AD ADLs Comments: independent     Extremity/Trunk Assessment   Upper Extremity Assessment Upper Extremity Assessment: Defer to OT evaluation    Lower Extremity Assessment Lower Extremity Assessment: RLE deficits/detail RLE Deficits / Details: Increased acute pain, weakness consistent with above mentioned surgery.    Cervical / Trunk Assessment Cervical / Trunk Assessment: Back Surgery  Communication   Communication Communication: No apparent difficulties Cueing Techniques: Verbal cues;Gestural cues  Cognition Arousal: Alert Behavior During Therapy: WFL for tasks assessed/performed Overall Cognitive Status: Within Functional Limits for tasks assessed                                          General Comments General comments (skin integrity, edema, etc.): Hematoma on R incision site on back, PA, RN, and patient aware    Exercises     Assessment/Plan    PT Assessment Patient needs continued PT services  PT Problem List Decreased strength;Decreased activity  tolerance;Decreased mobility;Decreased balance;Decreased knowledge of use of DME;Decreased safety awareness;Decreased knowledge of precautions;Pain       PT Treatment Interventions DME instruction;Gait training;Stair training;Therapeutic activities;Functional mobility training;Therapeutic exercise;Balance training;Patient/family education    PT Goals (Current goals can be found in the Care Plan section)  Acute Rehab PT Goals Patient Stated Goal: Home today PT Goal Formulation: With patient/family Time For Goal Achievement: 01/31/23 Potential to Achieve Goals: Good    Frequency Min 1X/week     Co-evaluation               AM-PAC PT "6 Clicks" Mobility  Outcome Measure Help needed turning from your back to your side while in a flat bed without using bedrails?: A Little Help needed moving from lying on your back to sitting on the side of a flat bed without using bedrails?: A Little Help needed moving to and from a bed to a chair (including a wheelchair)?: A Little Help needed standing up from a chair using your arms (e.g., wheelchair or bedside chair)?: A Little Help needed to walk in hospital room?: A Little Help needed climbing 3-5 steps with a railing? : A Little 6 Click Score: 18    End of Session Equipment Utilized During Treatment: Gait belt Activity Tolerance: Patient tolerated treatment well Patient left: in bed;with call bell/phone within reach;with family/visitor present Nurse Communication: Mobility status PT Visit Diagnosis: Unsteadiness on feet (R26.81);Pain Pain - Right/Left: Right    Time: 0927-1000 PT Time Calculation (min) (ACUTE ONLY): 33 min   Charges:   PT Evaluation $PT Eval Low Complexity: 1 Low PT Treatments $Gait Training: 8-22 mins PT General Charges $$ ACUTE PT VISIT: 1 Visit  Conni Slipper, PT, DPT Acute Rehabilitation Services Secure Chat Preferred Office: [phone]   Marylynn Pearson 01/24/2023, 11:24 AM

## 2023-01-24 NOTE — Discharge Summary (Signed)
Patient ID: Diana Mcpherson MRN: [id number] DOB/AGE: 13-Apr-1955 59 y.o.  Admit date: 01/23/2023 Discharge date: 01/24/2023  Admission Diagnoses: Sacroiliitis [facility]) [M46.1]   Discharge Diagnoses: Same   Discharged Condition: Stable  Hospital Course:  Diana Mcpherson is a 68 y.o. female who was admitted following an uncomplicated R SIJ fusion. They were recovered in PACU and transferred to [facility]. Hospital course was uncomplicated. She did develop hematoma and bruising around incisions which appear stable compared to yesterday, primarily to midline incision. Pt stable for discharge today. Pt to f/u in office for routine post op visit. Pt is in agreement w/ plan.    Discharge Exam: Blood pressure (!) 149/77, pulse 77, temperature 98.2 F (36.8 C), temperature source Oral, resp. rate 19, height 5\' 6"  (1.676 m), weight 92.1 kg, SpO2 100%. A&O x3 Speech fluent, appropriate Strength 5/5 x4.  SILTx4.  Dressings c/d/I.  Midline hematoma mildly ttp, grossly unchanged in size/appearance. Roughly 4cm  Disposition: Discharge disposition: 01-Home or Self Care       Discharge Instructions     Incentive spirometry RT   Complete by: As directed       Allergies as of 01/24/2023       Reactions   Duloxetine Other (See Comments), Palpitations   Heart palpitations     Heart racing / Increased blood pressure    Elevated HR   Erythromycin Diarrhea, Other (See Comments), Nausea Only   Diarrhea and severe heartburn   Gabapentin Palpitations, Other (See Comments), Tinitus   Vertigo; anxiety, hallucinations   Leflunomide Anaphylaxis, Other (See Comments)   Flu like symptoms   Morphine And Codeine Nausea And Vomiting, Palpitations   headache   Beta Adrenergic Blockers Other (See Comments)   Could not stop crying/Panic   Iodinated Contrast Media Hives, Other (See Comments)   After administration of CT contrast   Nabumetone Other (See Comments)   Gi- upset Relafen   Rosuvastatin Other  (See Comments)   Elevated liver enzymes   Atorvastatin Other (See Comments)   Acute Muscle spasm   Levofloxacin Other (See Comments)   Flu like symptoms and GI issues    Nsaids Other (See Comments)   GI issue of take more than once or twice a week   Doxycycline Diarrhea, Other (See Comments)   Ezetimibe Other (See Comments)   Unknown   Gadolinium Derivatives Hives   One hive with minimal itching after gadolinium  injection   Meloxicam Nausea Only, Nausea And Vomiting, Other (See Comments)   Nitrofurantoin Macrocrystal Diarrhea, Nausea And Vomiting   Tapentadol Hcl Nausea Only, Anxiety   Nucynta Weird Crying        Medication List     STOP taking these medications    HYDROcodone-acetaminophen 10-325 MG tablet Commonly known as: NORCO       TAKE these medications    betamethasone dipropionate 0.05 % ointment Commonly known as: DIPROLENE Apply 1 application  topically daily as needed (eczema).   CALTRATE 600 PLUS-VIT D PO Take 1 tablet by mouth at bedtime.   cephALEXin 250 MG capsule Commonly known as: KEFLEX Take 250 mg by mouth daily as needed (after intercourse).   cetirizine 10 MG tablet Commonly known as: ZYRTEC Take 10 mg by mouth daily as needed for allergies.   cycloSPORINE 0.05 % ophthalmic emulsion Commonly known as: RESTASIS Place 1 drop into both eyes 2 (two) times daily.   diazepam 10 MG tablet Commonly known as: VALIUM Take 2.5-3.3 mg by mouth every 6 (six) hours  as needed (muscle spasms).   docusate sodium 100 MG capsule Commonly known as: COLACE Take 100 mg by mouth 2 (two) times daily.   erythromycin with ethanol 2 % gel Commonly known as: EMGEL Apply 1 Application topically daily as needed (Rosacea).   estradiol 0.025 MG/24HR Commonly known as: VIVELLE-DOT Place 1 patch onto the skin 2 (two) times a week. Sat / Wed   Fish Oil 1200 MG Caps Take 1,200 mg by mouth at bedtime. burpless   Flonase Sensimist 27.5 MCG/SPRAY nasal  spray Generic drug: fluticasone Place 1 spray into the nose every evening.   GAS-X PO Take 1 tablet by mouth daily as needed (flatulence).   guaiFENesin 600 MG 12 hr tablet Commonly known as: MUCINEX Take 600 mg by mouth at bedtime.   latanoprost 0.005 % ophthalmic solution Commonly known as: XALATAN Place 1 drop into both eyes at bedtime.   levothyroxine 125 MCG tablet Commonly known as: SYNTHROID Take 125 mcg by mouth daily before breakfast.   methocarbamol 500 MG tablet Commonly known as: ROBAXIN Take 1 tablet (500 mg total) by mouth every 6 (six) hours as needed for muscle spasms.   minocycline 50 MG capsule Commonly known as: MINOCIN Take 50 mg by mouth daily.   omeprazole 20 MG capsule Commonly known as: PRILOSEC Take 1 capsule (20 mg total) by mouth daily.   Oxycodone HCl 10 MG Tabs Take 1 tablet (10 mg total) by mouth every 4 (four) hours as needed for severe pain (pain score 7-10).   polyethylene glycol 17 g packet Commonly known as: MIRALAX / GLYCOLAX Take 17 g by mouth daily as needed for mild constipation or moderate constipation.   Premarin vaginal cream Generic drug: conjugated estrogens Place 1 applicator vaginally 2 (two) times a week.   PreserVision AREDS 2+Multi Vit Caps Take 1 capsule by mouth daily.   PROBIOTIC PO Take 1 capsule by mouth daily.   Repatha SureClick 140 MG/ML Soaj Generic drug: Evolocumab Inject 140 mg into the skin every 14 (fourteen) days.   sodium chloride 0.65 % Soln nasal spray Commonly known as: OCEAN Place 1 spray into both nostrils every evening.   tacrolimus 0.1 % ointment Commonly known as: PROTOPIC Apply 1 Application topically daily as needed (Eczema).   valsartan 80 MG tablet Commonly known as: DIOVAN Take 80 mg by mouth at bedtime.   vitamin C 1000 MG tablet Take 1,000 mg by mouth at bedtime.   Vitamin D3 50 MCG (2000 UT) capsule Take 4,000 Units by mouth daily.         Signed: Clovis Riley 01/24/2023, 9:31 AM

## 2023-01-24 NOTE — Progress Notes (Signed)
Patient alert and oriented, void, ambulate. Surgical site clean and dry no sign of infections. D/c instructions explain and given to the patient all question answered.

## 2023-01-24 NOTE — Evaluation (Signed)
Occupational Therapy Evaluation Patient Details Name: Diana Mcpherson MRN: [id number] DOB: Apr 12, 1955 Today's Date: 01/24/2023   History of Present Illness Patient is a 68 yo female R SI joint fusion on 01/22/22. PMH includes:  HTN, HLD, GERD, hiatal hernia, PSVT, hypothyroidism (due to Hashimoto's thyroiditis), macular degeneration, glaucoma, anxiety, chronic headache, venous insufficiency (s/p laser vein ablation left GSV 06/10/17 & 02/24/18, laser vein ablation right GSV 07/22/17), spinal surgery (L4-5 fusion), hysterectomy.   Clinical Impression   Prior to this admission, patient independent with ADLs and occasionally walking with a cane when more fatigued or in pain. Currently, patient presenting with R leg pain, and with decreased activity tolerance and endurance. Patient able to complete lower body ADLs with min A, however husband is able to assist and she has a Sports administrator at home. OT providing education with regard to bed mobility to decrease pain, with patient benefitting from completing incremental scoots into long sitting in order to decrease time spent on R hip. Patient with all questions answered at end of session. Patient with no further need for acute intervention. OT will sign off at this time, please re-consult if further acute needs arise.       If plan is discharge home, recommend the following: A little help with walking and/or transfers;A little help with bathing/dressing/bathroom;Assist for transportation;Help with stairs or ramp for entrance (initially)    Functional Status Assessment  Patient has had a recent decline in their functional status and demonstrates the ability to make significant improvements in function in a reasonable and predictable amount of time.  Equipment Recommendations  None recommended by OT (Patient has DME needed)    Recommendations for Other Services       Precautions / Restrictions Precautions Precautions: Fall Restrictions Weight Bearing  Restrictions Per Provider Order: No      Mobility Bed Mobility Overal bed mobility: Needs Assistance Bed Mobility: Supine to Sit, Sit to Supine     Supine to sit: Contact guard Sit to supine: Contact guard assist   General bed mobility comments: Patient able to complete incremental scoot into long sitting in order to lay down, excellent log rol technique to sit up and get out of bed    Transfers Overall transfer level: Needs assistance Equipment used: Rolling walker (2 wheels) Transfers: Sit to/from Stand Sit to Stand: Contact guard assist           General transfer comment: cues for hand placement      Balance Overall balance assessment: Needs assistance Sitting-balance support: No upper extremity supported, Feet supported Sitting balance-Leahy Scale: Fair     Standing balance support: Bilateral upper extremity supported, During functional activity, Reliant on assistive device for balance Standing balance-Leahy Scale: Poor Standing balance comment: Reliant on RW currently, will progress                           ADL either performed or assessed with clinical judgement   ADL Overall ADL's : Needs assistance/impaired Eating/Feeding: Set up;Sitting   Grooming: Set up;Sitting   Upper Body Bathing: Set up;Sitting   Lower Body Bathing: Minimal assistance;Sitting/lateral leans;Sit to/from stand   Upper Body Dressing : Set up;Sitting   Lower Body Dressing: Minimal assistance;Sitting/lateral leans;Sit to/from stand Lower Body Dressing Details (indicate cue type and reason): educated on use of reacher to promote independence Toilet Transfer: Contact guard assist;Ambulation;Rolling walker (2 wheels)   Toileting- Clothing Manipulation and Hygiene: Contact guard assist;Sit to/from stand;Sitting/lateral lean  Functional mobility during ADLs: Contact guard assist;Cueing for sequencing;Cueing for safety;Rolling walker (2 wheels) General ADL Comments:  Patient presenting with R leg pain, and with decreased activity tolerance and endurance. Patient able to complete lower body ADLs with min A, however husband is able to assist and she has a Sports administrator at home. OT providing education with regard to bed mobility to decrease pain, with patient benefitting from completing incremental scoots into long sitting in order to decrease time spent on R hip. Patient with all questions answered at end of session. Patient with no further need for acute intervention. OT will sign off at this time, please re-consult if further acute needs arise.     Vision Baseline Vision/History: 1 Wears glasses Ability to See in Adequate Light: 0 Adequate Patient Visual Report: No change from baseline Vision Assessment?: No apparent visual deficits     Perception Perception: Not tested       Praxis Praxis: Not tested       Pertinent Vitals/Pain Pain Assessment Pain Assessment: 0-10 Pain Score: 6  Pain Location: R hip Pain Descriptors / Indicators: Grimacing, Guarding, Discomfort Pain Intervention(s): Limited activity within patient's tolerance, Monitored during session, Repositioned, Premedicated before session     Extremity/Trunk Assessment Upper Extremity Assessment Upper Extremity Assessment: Right hand dominant;Overall [employer] for tasks assessed   Lower Extremity Assessment Lower Extremity Assessment: Defer to PT evaluation   Cervical / Trunk Assessment Cervical / Trunk Assessment: Normal   Communication Communication Communication: No apparent difficulties Cueing Techniques: Verbal cues   Cognition Arousal: Alert Behavior During Therapy: WFL for tasks assessed/performed Overall Cognitive Status: Within Functional Limits for tasks assessed                                       General Comments  Hematoma on R incision site on back, PA, RN, and patient aware    Exercises     Shoulder Instructions      Home Living Family/patient  expects to be discharged to:: Private residence Living Arrangements: Spouse/significant other Available Help at Discharge: Available 24 hours/day Type of Home: House Home Access: Stairs to enter [employer] of Steps: 5 Entrance Stairs-Rails: Can reach both Home Layout: Two level Alternate Level Stairs-Number of Steps: 16 Alternate Level Stairs-Rails: Right;Can reach both Bathroom Shower/Tub: Producer, television/film/video: Handicapped height     Home Equipment: Agricultural consultant (2 wheels);Cane - quad;Hand held shower head;Adaptive equipment Adaptive Equipment: Reacher        Prior Functioning/Environment Prior Level of Function : Independent/Modified Independent;Driving             Mobility Comments: independent without AD ADLs Comments: independent        OT Problem List: Decreased strength;Decreased range of motion;Decreased activity tolerance;Impaired sensation;Pain      OT Treatment/Interventions:      OT Goals(Current goals can be found in the care plan section) Acute Rehab OT Goals Patient Stated Goal: to go home and get better OT Goal Formulation: With patient/family Time For Goal Achievement: 02/07/23 Potential to Achieve Goals: Good  OT Frequency:      Co-evaluation              AM-PAC OT "6 Clicks" Daily Activity     Outcome Measure Help from another person eating meals?: A Little Help from another person taking care of personal grooming?: A Little Help from another person toileting, which includes  using toliet, bedpan, or urinal?: A Little Help from another person bathing (including washing, rinsing, drying)?: A Little Help from another person to put on and taking off regular upper body clothing?: A Little Help from another person to put on and taking off regular lower body clothing?: A Little 6 Click Score: 18   End of Session Equipment Utilized During Treatment: Rolling walker (2 wheels) Nurse Communication: Mobility  status  Activity Tolerance: Patient tolerated treatment well Patient left: in bed;with call bell/phone within reach;with family/visitor present (sitting EOB)  OT Visit Diagnosis: Unsteadiness on feet (R26.81);Other abnormalities of gait and mobility (R26.89);Pain Pain - Right/Left: Right Pain - part of body: Hip                Time: 1610-9604 OT Time Calculation (min): 32 min Charges:  OT General Charges $OT Visit: 1 Visit OT Evaluation $OT Eval Moderate Complexity: 1 Mod OT Treatments $Self Care/Home Management : 8-22 mins  Pollyann Glen E. Karlis Cregg, OTR/L Acute Rehabilitation Services [phone]   Cherlyn Cushing 01/24/2023, 9:35 AM

## 2023-02-11 ENCOUNTER — Ambulatory Visit: Payer: Managed Care, Other (non HMO) | Admitting: Gastroenterology

## 2023-02-27 ENCOUNTER — Ambulatory Visit (INDEPENDENT_AMBULATORY_CARE_PROVIDER_SITE_OTHER): Payer: Managed Care, Other (non HMO) | Admitting: Gastroenterology

## 2023-02-27 ENCOUNTER — Encounter: Payer: Self-pay | Admitting: Gastroenterology

## 2023-02-27 VITALS — BP 140/76 | HR 98 | Ht 66.0 in | Wt 203.4 lb

## 2023-02-27 DIAGNOSIS — R1310 Dysphagia, unspecified: Secondary | ICD-10-CM | POA: Diagnosis not present

## 2023-02-27 DIAGNOSIS — R932 Abnormal findings on diagnostic imaging of liver and biliary tract: Secondary | ICD-10-CM | POA: Diagnosis not present

## 2023-02-27 DIAGNOSIS — K760 Fatty (change of) liver, not elsewhere classified: Secondary | ICD-10-CM | POA: Diagnosis not present

## 2023-02-27 DIAGNOSIS — R1010 Upper abdominal pain, unspecified: Secondary | ICD-10-CM

## 2023-02-27 DIAGNOSIS — Z860100 Personal history of colon polyps, unspecified: Secondary | ICD-10-CM

## 2023-02-27 MED ORDER — FDGARD 25-20.75 MG PO CAPS: ORAL_CAPSULE | ORAL | Status: AC

## 2023-02-27 NOTE — Patient Instructions (Addendum)
Continue Omeprazole.  We have given you samples of the following medication to take: FDgard take as needed.  Stop probiotic.  You will be due for a recall colonoscopy in July 2027. We will send you a reminder in the mail when it gets closer to that time.  Please have your primary care provider send a copy of your liver labs once they are completed: (fax) [phone].  Thank you for entrusting me with your care and for choosing [facility], Dr. Ileene Patrick   If your blood pressure at your visit was 140/90 or greater, please contact your primary care physician to follow up on this. ______________________________________________________  If you are age 79 or older, your body mass index should be between 23-30. Your Body mass index is 32.83 kg/m. If this is out of the aforementioned range listed, please consider follow up with your Primary Care Provider.  If you are age 73 or younger, your body mass index should be between 19-25. Your Body mass index is 32.83 kg/m. If this is out of the aformentioned range listed, please consider follow up with your Primary Care Provider.  ________________________________________________________  The [facility] providers would like to encourage you to use [facility] to communicate with providers for non-urgent requests or questions.  Due to long hold times on the telephone, sending your provider a message by [facility] may be a faster and more efficient way to get a response.  Please allow 48 business hours for a response.  Please remember that this is for non-urgent requests.  _______________________________________________________  Due to recent changes in healthcare laws, you may see the results of your imaging and laboratory studies on MyChart before your provider has had a chance to review them.  We understand that in some cases there may be results that are confusing or concerning to you. Not all laboratory results come back in the same time  frame and the provider may be waiting for multiple results in order to interpret others.  Please give [place] 48 hours in order for your provider to thoroughly review all the results before contacting the office for clarification of your results.

## 2023-02-27 NOTE — Progress Notes (Signed)
HPI :  68 year old female here for follow-up visit regarding a few issues.  Recall she has a history of cholecystectomy, GERD, chronic abdominal pain, some altered imaging of her liver, fatty liver, dysphagia.  I last saw her in the office in September 2024.  At that time she was having some dysphagia for about 18 months with both solids and liquids, needed to drink fluids to push food down at times and symptoms were pretty bothersome to her in addition to some reflux for which she was taking Pepcid occasionally.  I performed an EGD in November, there was no obvious stricture or high-grade stenosis but empiric dilation was performed.  She had some benign fundic gland polyps of her stomach which were sampled.  She states since the endoscopy she is "85%" improved and doing much better in regards to her dysphagia.  This has mostly resolved.  Sometimes when sh eats chicken it can take some time to get down but generally she does not have any significant problems with her swallowing and is happy with how she is doing.  She has been taking omeprazole on a daily basis, 20 mg daily for the past 5 weeks and thinks it is helping her abdominal discomfort.  Recall we discussed this at the last visit.  She describes discomfort in her left to mid upper abdomen.  She states this is been going on for at least a few years since 2020 or so.  Pain mostly localizes to the upper mid left abdomen, can sometimes radiate to the right upper side.  She states it feels sore after eating a meal, almost always this is when she feels it but it can happen at any time.  It usually lasts for a few hours and goes away.  She typically feels this immediately after she eats something.  She denies any routine use of NSAIDs. EGD did not show anything to cause this.  Recall she previously had an MRI of her abdomen showing a dilated CBD in the setting of cholecystectomy and normal LFTs and an intermediate lesion in her liver for which a follow-up  MRCP was recommended. She had an MRCP in November 2024.  Mild fatty liver noted.  She did have some dilation of her biliary tree without any mass lesions or filling defects and radiology favoring postcholecystectomy dilation that is benign.  She has had her liver enzymes checked and they are normal, due to have this checked again soon.  She states her primary care is checking this with her yearly labs next month.  She does think the omeprazole daily has helped with this and it is not nearly as bothersome or frequent but she still gets it from time to time after eating.  We discussed some options to treat.  She inquires if she should be taking a probiotic every day.  She takes a probiotic every day and has done so for years since she had C. difficile several years ago.  She states it is anywhere from $60-$70 a bottle at this point.  We did discuss fatty liver that is mild on her ultrasound.  Weight is stable around 203 pounds, BMI 32.  We discussed what this entailed and risk for fibrosis cirrhosis.  She does not drink any alcohol.  She has been on chronic pain management for back pain.  She did have a SI joint fusion in the past year and states her mobility is much better, she is more active and is going through PT and  in general doing much better.     Prior workup: Colonsoscopy 07/2018 - Dr. Loreta Ave - 2 small ascending colon polyps, diverticulosis, hemorrhoids - path shows adenomas   EGD 07/2018 - Dr. Loreta Ave - small gastric polyps, prominent gastric folds, biopsies taken - benign path, negative for HP or GIM   Colonoscopy 06/2013 - one small polyp EGD 06/2013 - small hiatal hernia, otherwise normal   Colonoscopy 01/2010 - adenoma removed EGD 01/2010 - gastritis   RUQ [place] 08/10/2018: IMPRESSION: Gallbladder absent.  Study otherwise unremarkable.   CT abdomen / pelvis with contrast 08/27/18: IMPRESSION: No acute findings or other significant abnormality.   Aortic Atherosclerosis (ICD10-I70.0).   MRI  abdomen 03/18/20: IMPRESSION: 1. No acute findings in the abdomen. 2. Persistent post cholecystectomy biliary duct dilation may be post cholecystectomy baseline. Correlation with liver function may be useful. No filling defects are noted. MRCP sequences not performed. 3. Mild hepatic steatosis. 4. Pancreatic atrophy. 5. Subtle lesion in the medial segment of the LEFT hepatic lobe is likely a benign shunt lesion or hemangioma but lacks classic characteristics in that its intensity on later images is not the same is adjacent blood pool. Consider six-month to document stability.     CT scan 07/2001 - epiploic appendagitis   MRCP 11/24: FINDINGS: Lower chest:  Lung bases are clear.   Hepatobiliary: No intrahepatic biliary duct dilatation. Postcholecystectomy. The common hepatic duct measures 15 mm. The common bile duct measures 9 mm. The common bile duct tapers gradually to the ampulla without evidence of filling defect or obstruction. Cystic duct remnant noted.   There is no focal hepatic lesion on noncontrast exam. Mild hepatic steatosis noted on opposed phase imaging (series 5).   Pancreas: The pancreas is largely fatty replaced. There is no pancreatic duct dilatation. No variant pancreatic ductal anatomy.   Spleen: Normal spleen.   Adrenals/urinary tract: Adrenal glands and kidneys are normal.   Stomach/Bowel: Stomach and limited of the small bowel is unremarkable   Vascular/Lymphatic: Abdominal aortic normal caliber. No retroperitoneal periportal lymphadenopathy.   Musculoskeletal: No aggressive osseous lesion.   IMPRESSION: 1. Extrahepatic biliary duct dilatation without obstructing lesion or filling defect. Favor benign post cholecystectomy dilatation. 2. Mild hepatic steatosis. 3. Fatty replacement of the pancreas. No evidence of pancreatic inflammation.    EGD 11/11/22 - Esophagogastric landmarks were identified: the Z-line was found at 38 cm, the  gastroesophageal junction was found at 38 cm and the upper extent of the gastric folds was found at 39 cm from the incisors. Findings: - A 1 cm hiatal hernia was present. - There was a benign gastric inlet patch in the proximal esophagus. The exam of the esophagus was otherwise normal. No focal stenosis / stricture appreciated. - A guidewire was placed and the scope was withdrawn. Empiric dilation was performed in the entire esophagus with a Savary dilator with mild resistance at 17 mm. Relook endoscopy showed no mucosal wrents. - Patchy mildly erythematous mucosa was found in the gastric fundus and in the gastric body. Biopsies were taken with a cold forceps for Helicobacter pylori testing. - A few small sessile polyps were found in the gastric fundus and in the gastric body - suspect benign fundic gland polyps. A few representative polyps were removed with a cold biopsy forceps. Resection and retrieval were complete. - The exam of the stomach was otherwise normal. - The examined duodenum was normal. Ampulla only partially seen with forward viewing scope.  FINAL DIAGNOSIS  1. Surgical [P], gastric bx :       -  ANTRAL AND OXYNTIC MUCOSA WITH NO SIGNIFICANT PATHOLOGY.       -  NO HELICOBACTER PYLORI ORGANISMS IDENTIFIED ON H&E STAINED SLIDE.        2. Surgical [P], gastric polyps bx :       -  FUNDIC GLAND POLYPS.    Past Medical History:  Diagnosis Date   Allergic rhinitis    Anxiety    Arthritis    Blepharitis of both eyes    on chronic ABX   Dry eyes    GERD (gastroesophageal reflux disease)    Glaucoma    both eyes   Headache    chronic   Hiatal hernia    Hyperlipidemia    Hypertension    Hypothyroidism due to Hashimoto's thyroiditis    Macular degeneration disease    Macular pucker, right eye    Osteoarthritis    SVT (supraventricular tachycardia) (HCC)      Past Surgical History:  Procedure Laterality Date   APPENDECTOMY     BACK SURGERY     BIOPSY THYROID      BREAST EXCISIONAL BIOPSY Left    CATARACT EXTRACTION     CESAREAN SECTION     CHOLECYSTECTOMY     COLONOSCOPY     with EGD Dr Loreta Ave 2020   HAND SURGERY Right    LRTI with suspension w/ Dr. Amanda Pea   IR EMBO VENOUS NOT HEMORR HEMANG  INC GUIDE ROADMAPPING  06/10/2017   IR EMBO VENOUS NOT HEMORR HEMANG  INC GUIDE ROADMAPPING  07/22/2017   IR EMBO VENOUS NOT HEMORR HEMANG  INC GUIDE ROADMAPPING  02/24/2018   LAMINECTOMY     [id number]   SACROILIAC JOINT FUSION Right 01/23/2023   Procedure: Minimally Invasive Sacroiliac Joint Fusion;  Surgeon: Bethann Goo, DO;  Location: [facility] OR;  Service: Neurosurgery;  Laterality: Right;  3C   TONSILLECTOMY     VAGINAL HYSTERECTOMY     YAG LASER APPLICATION     Family History  Problem Relation Age of Onset   Colon polyps Mother    Dementia Mother    Heart disease Mother    Colon polyps Father    Diabetes Father    Emphysema Father    Asthma Sister    Breast cancer Maternal Aunt    Breast cancer Paternal Aunt    Breast cancer Paternal Aunt    Breast cancer Niece        44s   Colon cancer Neg Hx    Stomach cancer Neg Hx    Esophageal cancer Neg Hx    Pancreatic cancer Neg Hx    Social History   Tobacco Use   Smoking status: Former    Current packs/day: 0.00    Average packs/day: 1 pack/day for 7.0 years (7.0 ttl pk-yrs)    Types: Cigarettes    Start date: 04/13/1972    Quit date: 04/14/1979    Years since quitting: 43.9   Smokeless tobacco: Never  Vaping Use   Vaping status: Never Used  Substance Use Topics   Alcohol use: No    Alcohol/week: 0.0 standard drinks of alcohol   Drug use: Not Currently   Current Outpatient Medications  Medication Sig Dispense Refill   Ascorbic Acid (VITAMIN C) 1000 MG tablet Take 1,000 mg by mouth at bedtime.     betamethasone dipropionate (DIPROLENE) 0.05 % ointment Apply 1 application  topically daily as needed (eczema).  Calcium-Vitamin D (CALTRATE 600 PLUS-VIT D PO) Take 1 tablet by mouth at bedtime.      cephALEXin (KEFLEX) 250 MG capsule Take 250 mg by mouth daily as needed (after intercourse).     cetirizine (ZYRTEC) 10 MG tablet Take 10 mg by mouth daily as needed for allergies.     Cholecalciferol (VITAMIN D3) 50 MCG (2000 UT) capsule Take 4,000 Units by mouth daily.     conjugated estrogens (PREMARIN) vaginal cream Place 1 applicator vaginally 2 (two) times a week.     cycloSPORINE (RESTASIS) 0.05 % ophthalmic emulsion Place 1 drop into both eyes 2 (two) times daily.     diazepam (VALIUM) 10 MG tablet Take 2.5-3.3 mg by mouth every 6 (six) hours as needed (muscle spasms).     docusate sodium (COLACE) 100 MG capsule Take 100 mg by mouth 2 (two) times daily.     erythromycin with ethanol (EMGEL) 2 % gel Apply 1 Application topically daily as needed (Rosacea).     estradiol (VIVELLE-DOT) 0.025 MG/24HR Place 1 patch onto the skin 2 (two) times a week. Sat / Wed     Evolocumab (REPATHA SURECLICK) 140 MG/ML SOAJ Inject 140 mg into the skin every 14 (fourteen) days.     fluticasone (FLONASE SENSIMIST) 27.5 MCG/SPRAY nasal spray Place 1 spray into the nose every evening.     guaiFENesin (MUCINEX) 600 MG 12 hr tablet Take 600 mg by mouth at bedtime.     latanoprost (XALATAN) 0.005 % ophthalmic solution Place 1 drop into both eyes at bedtime.     levothyroxine (SYNTHROID) 125 MCG tablet Take 125 mcg by mouth daily before breakfast.     methocarbamol (ROBAXIN) 500 MG tablet Take 1 tablet (500 mg total) by mouth every 6 (six) hours as needed for muscle spasms. 120 tablet 2   minocycline (MINOCIN) 50 MG capsule Take 50 mg by mouth daily.     Multiple Vitamins-Minerals (PRESERVISION AREDS 2+MULTI VIT) CAPS Take 1 capsule by mouth daily.     Omega-3 Fatty Acids (FISH OIL) 1200 MG CAPS Take 1,200 mg by mouth at bedtime. burpless     omeprazole (PRILOSEC) 20 MG capsule Take 1 capsule (20 mg total) by mouth daily. 30 capsule 5   oxyCODONE 10 MG TABS Take 1 tablet (10 mg total) by mouth every 4 (four) hours  as needed for severe pain (pain score 7-10). 30 tablet 0   polyethylene glycol (MIRALAX / GLYCOLAX) 17 g packet Take 17 g by mouth daily as needed for mild constipation or moderate constipation.     Probiotic Product (PROBIOTIC PO) Take 1 capsule by mouth daily.     Simethicone (GAS-X PO) Take 1 tablet by mouth daily as needed (flatulence).     sodium chloride (OCEAN) 0.65 % SOLN nasal spray Place 1 spray into both nostrils every evening.     tacrolimus (PROTOPIC) 0.1 % ointment Apply 1 Application topically daily as needed (Eczema).     valsartan (DIOVAN) 80 MG tablet Take 80 mg by mouth at bedtime.     No current facility-administered medications for this visit.   Allergies  Allergen Reactions   Duloxetine Other (See Comments) and Palpitations    Heart palpitations     Heart racing / Increased blood pressure    Elevated HR   Erythromycin Diarrhea, Other (See Comments) and Nausea Only    Diarrhea and severe heartburn   Gabapentin Palpitations, Other (See Comments) and Tinitus    Vertigo; anxiety, hallucinations   Leflunomide  Anaphylaxis and Other (See Comments)    Flu like symptoms   Morphine And Codeine Nausea And Vomiting and Palpitations    headache   Beta Adrenergic Blockers Other (See Comments)    Could not stop crying/Panic   Iodinated Contrast Media Hives and Other (See Comments)    After administration of CT contrast   Nabumetone Other (See Comments)    Gi- upset Relafen   Rosuvastatin Other (See Comments)    Elevated liver enzymes   Atorvastatin Other (See Comments)    Acute Muscle spasm   Levofloxacin Other (See Comments)    Flu like symptoms and GI issues    Nsaids Other (See Comments)    GI issue of take more than once or twice a week   Doxycycline Diarrhea and Other (See Comments)   Ezetimibe Other (See Comments)    Unknown   Gadolinium Derivatives Hives    One hive with minimal itching after gadolinium  injection   Meloxicam Nausea Only, Nausea And  Vomiting and Other (See Comments)   Nitrofurantoin Macrocrystal Diarrhea and Nausea And Vomiting   Tapentadol Hcl Nausea Only and Anxiety    Nucynta Weird Crying      Review of Systems: All systems reviewed and negative except where noted in HPI.   Lab Results  Component Value Date   WBC 4.8 01/20/2023   HGB 14.3 01/20/2023   HCT 43.7 01/20/2023   MCV 93.6 01/20/2023   PLT 185 01/20/2023    Lab Results  Component Value Date   NA 140 01/20/2023   CL 102 01/20/2023   K 4.1 01/20/2023   CO2 29 01/20/2023   BUN 21 01/20/2023   CREATININE 1.03 (H) 01/20/2023   GFRNONAA 59 (L) 01/20/2023   CALCIUM 10.0 01/20/2023   GLUCOSE 95 01/20/2023    No results found for: "ALT", "AST", "GGT", "ALKPHOS", "BILITOT"   Physical Exam: BP (!) 140/76   Pulse 98   Ht 5\' 6"  (1.676 m)   Wt 203 lb 6.4 oz (92.3 kg)   SpO2 99%   BMI 32.83 kg/m  Constitutional: Pleasant,well-developed, female in no acute distress. Neurological: Alert and oriented to person place and time. Psychiatric: Normal mood and affect. Behavior is normal.   ASSESSMENT: 68 y.o. female here for assessment of the following  1. Dysphagia, unspecified type   2. Pain of upper abdomen   3. Abnormal liver diagnostic imaging   4. Metabolic dysfunction-associated steatotic liver disease (MASLD)   5. History of colon polyps    Discussed all of these issues for a bit today.  Generally she is doing well.  Dysphagia significantly improved post empiric dilation.  Glad to hear this, she will monitor for now.  Suspect she could have some component of dysmotility from narcotic use but clearly dilation has made a benefit.  We can do this as needed in the future if it recurs.  GERD better controlled, on omeprazole.  Her pain is improved with daily use but not resolved.  Reassured her on findings from her imaging and EGD.  We discussed some other options, will add trial of FD guard to use as needed to see if that helps  Reviewed  imaging of her liver.  I think she has benign postcholecystectomy related dilation of her biliary tree.  This appears stable without any concerning or high risk lesions.  I would like to trend her liver enzymes over time, make sure no elevation in alk phos or bili.  She is having this done  with her primary care next month and would like to see a copy of that.  We discussed fatty liver, risks for fibrosis and cirrhosis, over time work on weight loss to help prevent fibrosis.  She is having her liver enzymes checked soon and her platelet count is normal.  We discussed her history of probiotic use and I do not think she needs to take that moving forward, her C. difficile was a long time ago and no evidence that would prevent recurrent C. difficile anyway.  She understands and agrees, will stop that for now.  Due for her next colonoscopy July 2027 based on updated guidelines for surveillance of small colon polyps.   PLAN: - dysphagia 85% better, monitor for now - reviewed workup for abdominal pain - nothing concerning noted. Suspect biliary changes are post cholecystectomy - LFTs per PCP next month - work on weight loss, counseled on fatty liver, risks for fibrosis - continue omeprazole 20mg  / day - helping - trial of FD gard PRN for symptoms, samples given - stop probiotic - history of C diff many years ago, signficant cost to this currently - recall colonoscopy July 2027  Harlin Rain, MD [facility]

## 2023-05-06 ENCOUNTER — Other Ambulatory Visit: Payer: Self-pay | Admitting: Physician Assistant

## 2023-05-06 DIAGNOSIS — Z1231 Encounter for screening mammogram for malignant neoplasm of breast: Secondary | ICD-10-CM

## 2023-05-16 ENCOUNTER — Other Ambulatory Visit: Payer: Self-pay

## 2023-05-16 ENCOUNTER — Other Ambulatory Visit (HOSPITAL_COMMUNITY): Payer: Self-pay

## 2023-05-16 MED ORDER — OMEPRAZOLE 20 MG PO CPDR
20.0000 mg | DELAYED_RELEASE_CAPSULE | Freq: Every day | ORAL | 5 refills | Status: DC
  Filled 2023-05-16: qty 30, 30d supply, fill #0

## 2023-05-16 MED ORDER — OMEPRAZOLE 20 MG PO CPDR: 20.0000 mg | DELAYED_RELEASE_CAPSULE | Freq: Every day | ORAL | 5 refills | Status: AC

## 2023-05-19 ENCOUNTER — Ambulatory Visit
Admission: RE | Admit: 2023-05-19 | Discharge: 2023-05-19 | Disposition: A | Discharge status: Home / Self Care | Source: Ambulatory Visit | Attending: Physician Assistant | Admitting: Physician Assistant

## 2023-05-19 DIAGNOSIS — Z1231 Encounter for screening mammogram for malignant neoplasm of breast: Secondary | ICD-10-CM

## 2023-05-22 ENCOUNTER — Other Ambulatory Visit: Payer: Self-pay | Admitting: Physician Assistant

## 2023-05-22 DIAGNOSIS — R928 Other abnormal and inconclusive findings on diagnostic imaging of breast: Secondary | ICD-10-CM

## 2023-06-04 ENCOUNTER — Ambulatory Visit
Admission: RE | Admit: 2023-06-04 | Discharge: 2023-06-04 | Disposition: A | Discharge status: Home / Self Care | Source: Ambulatory Visit | Attending: Physician Assistant | Admitting: Physician Assistant

## 2023-06-04 DIAGNOSIS — R928 Other abnormal and inconclusive findings on diagnostic imaging of breast: Secondary | ICD-10-CM

## 2023-06-10 ENCOUNTER — Encounter

## 2023-06-10 ENCOUNTER — Other Ambulatory Visit

## 2023-12-22 ENCOUNTER — Encounter: Payer: Self-pay | Admitting: Neurosurgery

## 2023-12-22 ENCOUNTER — Other Ambulatory Visit: Payer: Self-pay | Admitting: Neurosurgery

## 2023-12-22 DIAGNOSIS — M5416 Radiculopathy, lumbar region: Secondary | ICD-10-CM

## 2023-12-23 ENCOUNTER — Inpatient Hospital Stay: Admission: RE | Admit: 2023-12-23 | Discharge: 2023-12-23 | Attending: Neurosurgery

## 2023-12-23 DIAGNOSIS — M5416 Radiculopathy, lumbar region: Secondary | ICD-10-CM

## 2024-01-20 ENCOUNTER — Other Ambulatory Visit: Payer: Self-pay | Admitting: Surgery

## 2024-02-10 ENCOUNTER — Encounter: Payer: Self-pay | Admitting: Physician Assistant

## 2024-02-11 ENCOUNTER — Other Ambulatory Visit: Payer: Self-pay | Admitting: Physician Assistant

## 2024-02-11 DIAGNOSIS — N6002 Solitary cyst of left breast: Secondary | ICD-10-CM

## 2024-02-24 ENCOUNTER — Other Ambulatory Visit

## 2024-02-24 ENCOUNTER — Encounter
